# Patient Record
Sex: Male | Born: 1965 | Race: White | Hispanic: No | Marital: Married | Smoking: Never smoker
Health system: Southern US, Community
[De-identification: ages and names within clinical notes are randomized; demographics above are authoritative.]

## PROBLEM LIST (undated history)

## (undated) DIAGNOSIS — M1990 Unspecified osteoarthritis, unspecified site: Secondary | ICD-10-CM

## (undated) DIAGNOSIS — I10 Essential (primary) hypertension: Secondary | ICD-10-CM

## (undated) DIAGNOSIS — E079 Disorder of thyroid, unspecified: Secondary | ICD-10-CM

## (undated) DIAGNOSIS — Z8619 Personal history of other infectious and parasitic diseases: Secondary | ICD-10-CM

## (undated) DIAGNOSIS — K219 Gastro-esophageal reflux disease without esophagitis: Secondary | ICD-10-CM

## (undated) DIAGNOSIS — E785 Hyperlipidemia, unspecified: Secondary | ICD-10-CM

## (undated) DIAGNOSIS — N059 Unspecified nephritic syndrome with unspecified morphologic changes: Secondary | ICD-10-CM

## (undated) DIAGNOSIS — S82401A Unspecified fracture of shaft of right fibula, initial encounter for closed fracture: Secondary | ICD-10-CM

## (undated) DIAGNOSIS — E119 Type 2 diabetes mellitus without complications: Secondary | ICD-10-CM

## (undated) DIAGNOSIS — Z86010 Personal history of colonic polyps: Secondary | ICD-10-CM

## (undated) DIAGNOSIS — S82201A Unspecified fracture of shaft of right tibia, initial encounter for closed fracture: Secondary | ICD-10-CM

## (undated) DIAGNOSIS — T7840XA Allergy, unspecified, initial encounter: Secondary | ICD-10-CM

## (undated) DIAGNOSIS — S42001A Fracture of unspecified part of right clavicle, initial encounter for closed fracture: Secondary | ICD-10-CM

## (undated) HISTORY — DX: Unspecified osteoarthritis, unspecified site: M19.90

## (undated) HISTORY — DX: Unspecified fracture of shaft of right fibula, initial encounter for closed fracture: S82.401A

## (undated) HISTORY — DX: Fracture of unspecified part of right clavicle, initial encounter for closed fracture: S42.001A

## (undated) HISTORY — PX: WISDOM TOOTH EXTRACTION: SHX21

## (undated) HISTORY — DX: Allergy, unspecified, initial encounter: T78.40XA

## (undated) HISTORY — DX: Essential (primary) hypertension: I10

## (undated) HISTORY — DX: Unspecified nephritic syndrome with unspecified morphologic changes: N05.9

## (undated) HISTORY — DX: Personal history of colonic polyps: Z86.010

## (undated) HISTORY — DX: Unspecified fracture of shaft of right tibia, initial encounter for closed fracture: S82.201A

## (undated) HISTORY — DX: Disorder of thyroid, unspecified: E07.9

## (undated) HISTORY — DX: Personal history of other infectious and parasitic diseases: Z86.19

## (undated) HISTORY — DX: Gastro-esophageal reflux disease without esophagitis: K21.9

## (undated) HISTORY — DX: Type 2 diabetes mellitus without complications: E11.9

## (undated) HISTORY — DX: Hyperlipidemia, unspecified: E78.5

---
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
## 1996-10-03 DIAGNOSIS — S82401A Unspecified fracture of shaft of right fibula, initial encounter for closed fracture: Secondary | ICD-10-CM

## 1996-10-03 HISTORY — DX: Unspecified fracture of shaft of right fibula, initial encounter for closed fracture: S82.401A

## 1999-12-07 ENCOUNTER — Encounter: Payer: Self-pay | Admitting: Internal Medicine

## 1999-12-07 ENCOUNTER — Encounter: Admission: RE | Admit: 1999-12-07 | Discharge: 1999-12-07 | Payer: Self-pay | Admitting: Internal Medicine

## 2007-08-23 ENCOUNTER — Encounter: Admission: RE | Admit: 2007-08-23 | Discharge: 2007-08-23 | Payer: Self-pay | Admitting: Internal Medicine

## 2008-07-29 ENCOUNTER — Ambulatory Visit: Payer: Self-pay | Admitting: Internal Medicine

## 2008-09-22 ENCOUNTER — Ambulatory Visit: Payer: Self-pay | Admitting: Internal Medicine

## 2009-01-09 ENCOUNTER — Ambulatory Visit: Payer: Self-pay | Admitting: Internal Medicine

## 2009-02-03 ENCOUNTER — Ambulatory Visit: Payer: Self-pay | Admitting: Internal Medicine

## 2009-10-27 ENCOUNTER — Encounter: Payer: Self-pay | Admitting: Internal Medicine

## 2009-10-27 ENCOUNTER — Ambulatory Visit: Payer: Self-pay | Admitting: Internal Medicine

## 2010-04-06 ENCOUNTER — Ambulatory Visit: Payer: Self-pay | Admitting: Internal Medicine

## 2010-10-26 ENCOUNTER — Ambulatory Visit: Admit: 2010-10-26 | Payer: Self-pay | Admitting: Internal Medicine

## 2010-10-26 ENCOUNTER — Encounter: Payer: Self-pay | Admitting: Internal Medicine

## 2010-10-27 ENCOUNTER — Encounter: Payer: Self-pay | Admitting: Internal Medicine

## 2010-10-28 ENCOUNTER — Encounter: Payer: Self-pay | Admitting: Internal Medicine

## 2010-10-28 ENCOUNTER — Ambulatory Visit: Admit: 2010-10-28 | Payer: Self-pay | Admitting: Internal Medicine

## 2010-11-02 ENCOUNTER — Ambulatory Visit
Admission: RE | Admit: 2010-11-02 | Discharge: 2010-11-02 | Payer: Self-pay | Source: Home / Self Care | Attending: Internal Medicine | Admitting: Internal Medicine

## 2010-11-02 DIAGNOSIS — E785 Hyperlipidemia, unspecified: Secondary | ICD-10-CM

## 2010-11-02 DIAGNOSIS — I10 Essential (primary) hypertension: Secondary | ICD-10-CM

## 2010-11-02 DIAGNOSIS — E039 Hypothyroidism, unspecified: Secondary | ICD-10-CM

## 2010-11-05 ENCOUNTER — Emergency Department (HOSPITAL_COMMUNITY)
Admission: EM | Admit: 2010-11-05 | Discharge: 2010-11-05 | Disposition: A | Discharge status: Home / Self Care | Payer: 59 | Attending: Emergency Medicine | Admitting: Emergency Medicine

## 2010-11-05 ENCOUNTER — Emergency Department (HOSPITAL_COMMUNITY): Payer: 59

## 2010-11-05 DIAGNOSIS — R42 Dizziness and giddiness: Secondary | ICD-10-CM | POA: Insufficient documentation

## 2010-11-05 DIAGNOSIS — I10 Essential (primary) hypertension: Secondary | ICD-10-CM | POA: Insufficient documentation

## 2010-11-05 DIAGNOSIS — E039 Hypothyroidism, unspecified: Secondary | ICD-10-CM | POA: Insufficient documentation

## 2010-11-05 DIAGNOSIS — R002 Palpitations: Secondary | ICD-10-CM | POA: Insufficient documentation

## 2010-11-05 DIAGNOSIS — Z79899 Other long term (current) drug therapy: Secondary | ICD-10-CM | POA: Insufficient documentation

## 2010-11-05 DIAGNOSIS — Z7982 Long term (current) use of aspirin: Secondary | ICD-10-CM | POA: Insufficient documentation

## 2010-11-05 LAB — POCT CARDIAC MARKERS
CKMB, poc: <1 ng/mL — ABNORMAL LOW (ref 1.0–8.0)
CKMB, poc: <1 ng/mL — ABNORMAL LOW (ref 1.0–8.0)
Myoglobin, poc: 63.8 ng/mL (ref 12–200)
Troponin i, poc: <0.05 ng/mL (ref 0.00–0.09)
Troponin i, poc: <0.05 ng/mL (ref 0.00–0.09)

## 2010-11-05 LAB — COMPREHENSIVE METABOLIC PANEL
ALT: 38 U/L (ref 0–53)
AST: 33 U/L (ref 0–37)
Albumin: 4.6 g/dL (ref 3.5–5.2)
Alkaline Phosphatase: 57 U/L (ref 39–117)
BUN: 12 mg/dL (ref 6–23)
CO2: 24 mEq/L (ref 19–32)
Calcium: 9.2 mg/dL (ref 8.4–10.5)
Chloride: 103 mEq/L (ref 96–112)
Creatinine, Ser: 1.07 mg/dL (ref 0.4–1.5)
GFR calc Af Amer: >60 mL/min (ref >60)
GFR calc non Af Amer: >60 mL/min (ref >60)
Glucose, Bld: 144 mg/dL — ABNORMAL HIGH (ref 70–99)
Potassium: 3.7 mEq/L (ref 3.5–5.1)
Sodium: 136 mEq/L (ref 135–145)
Total Bilirubin: 0.5 mg/dL (ref 0.3–1.2)
Total Protein: 7.4 g/dL (ref 6.0–8.3)

## 2010-11-05 LAB — DIFFERENTIAL
Basophils Absolute: 0 10*3/uL (ref 0.0–0.1)
Basophils Relative: 0 % (ref 0–1)
Eosinophils Absolute: 0.1 10*3/uL (ref 0.0–0.7)
Eosinophils Relative: 1 % (ref 0–5)
Lymphocytes Relative: 37 % (ref 12–46)
Lymphs Abs: 2 10*3/uL (ref 0.7–4.0)
Monocytes Absolute: 0.4 10*3/uL (ref 0.1–1.0)
Monocytes Relative: 7 % (ref 3–12)
Neutro Abs: 3.1 10*3/uL (ref 1.7–7.7)
Neutrophils Relative %: 55 % (ref 43–77)

## 2010-11-05 LAB — CBC
HCT: 43.6 % (ref 39.0–52.0)
Hemoglobin: 14.6 g/dL (ref 13.0–17.0)
MCH: 26.7 pg (ref 26.0–34.0)
MCHC: 33.5 g/dL (ref 30.0–36.0)
MCV: 79.9 fL (ref 78.0–100.0)
Platelets: 273 10*3/uL (ref 150–400)
RBC: 5.46 MIL/uL (ref 4.22–5.81)
RDW: 12.5 % (ref 11.5–15.5)
WBC: 5.6 10*3/uL (ref 4.0–10.5)

## 2010-11-29 ENCOUNTER — Encounter: Payer: Self-pay | Admitting: Internal Medicine

## 2010-11-29 ENCOUNTER — Ambulatory Visit (INDEPENDENT_AMBULATORY_CARE_PROVIDER_SITE_OTHER): Payer: 59 | Admitting: Internal Medicine

## 2010-11-29 ENCOUNTER — Other Ambulatory Visit: Payer: Self-pay | Admitting: Internal Medicine

## 2010-11-29 DIAGNOSIS — R002 Palpitations: Secondary | ICD-10-CM | POA: Insufficient documentation

## 2010-11-29 DIAGNOSIS — Z136 Encounter for screening for cardiovascular disorders: Secondary | ICD-10-CM

## 2010-11-29 DIAGNOSIS — E785 Hyperlipidemia, unspecified: Secondary | ICD-10-CM | POA: Insufficient documentation

## 2010-11-29 DIAGNOSIS — Z79899 Other long term (current) drug therapy: Secondary | ICD-10-CM

## 2010-11-30 ENCOUNTER — Telehealth: Payer: Self-pay | Admitting: Internal Medicine

## 2010-11-30 LAB — TSH: TSH: 1.41 u[IU]/mL (ref 0.35–5.50)

## 2010-11-30 LAB — HEMOGLOBIN A1C: Hgb A1c MFr Bld: 6.2 % (ref 4.6–6.5)

## 2010-12-08 ENCOUNTER — Encounter (INDEPENDENT_AMBULATORY_CARE_PROVIDER_SITE_OTHER): Payer: Self-pay | Admitting: *Deleted

## 2010-12-09 ENCOUNTER — Encounter: Payer: Self-pay | Admitting: Internal Medicine

## 2010-12-09 NOTE — Letter (Signed)
Summary: [facility]  [facility]   Imported By: Marylou Mccoy 12/03/2010 15:48:57  _____________________________________________________________________  External Attachment:    Type:   Image     Comment:   External Document

## 2010-12-09 NOTE — Letter (Signed)
Summary: Dr. Germ[facility] 2010-2012  Dr[facility] 2010-2012   Imported By: Marylou Mccoy 11/26/2010 14:52:53  _____________________________________________________________________  External Attachment:    Type:   Image     Comment:   External Document

## 2010-12-09 NOTE — Progress Notes (Signed)
Summary: pt calling re lab results  Phone Note Call from Patient   Caller: Patient [phone] ok to leave msg Reason for Call: Talk to Nurse, Lab or Test Results Summary of Call: pt calling re lab results Initial call taken by: Glynda Jaeger,  November 30, 2010 9:31 AM  Follow-up for Phone Call        awaiting lab results, will call pt when have them Meredith Staggers, RN  November 30, 2010 10:08 AM   left mess about lab results Meredith Staggers, RN  November 30, 2010 2:22 PM

## 2010-12-14 NOTE — Assessment & Plan Note (Signed)
Summary: np6/cardiac eval/ family hx of cardiac disease notes on file ...   Visit Type:  eph  CC:  Palpitations and family history.  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 Referred by Dr. Lenord Fellers for further evaluation of palpitations and cardiac assessment.   About 3 weeks ago started on synthroid 75qd. Later that day developed tachypalpitations. Went to ED and ECG was sinus tach 122 no ectopy. Got 1 un NS and felt better. No palpitations since that time but does feel anxious since starting on synthroid.   Denies any known CAD. Has never had a stress test or cath. Strong FHX of CAD with mother Joyce Gross Pedretti) having CAD in her 11s. Active in spurts with mountain biking but not consistent. No exertional CP or undue dyspnea.   Recent lipids TC 152 TG 145 HDL 33 LDL 90   Preventive Screening-Counseling & Management  Alcohol-Tobacco     Smoking Status: never      Drug Use:  no.    Problems Prior to Update: None  Medications Prior to Update: 1)  Lipitor 80 Mg Tabs (Atorvastatin Calcium) .Marland Kitchen.. 1 Tab Once Daily 2)  Ramipril 10 Mg Caps (Ramipril) .... Take One Capsule By Mouth Daily 3)  Tricor 145 Mg Tabs (Fenofibrate) .Marland Kitchen.. 1 Tab Once Daily  Current Medications (verified): 1)  Lipitor 80 Mg Tabs (Atorvastatin Calcium) .Marland Kitchen.. 1 Tab Once Daily 2)  Ramipril 10 Mg Caps (Ramipril) .... Take One Capsule By Mouth Daily 3)  Tricor 145 Mg Tabs (Fenofibrate) .Marland Kitchen.. 1 Tab Once Daily 4)  Synthroid 75 Mcg Tabs (Levothyroxine Sodium) .... Once Daily 5)  Aspirin 81 Mg Tbec (Aspirin) .... Take One Tablet By Mouth Daily  Allergies (verified): No Known Drug Allergies  Past History:  Past Medical History: Last updated: 11/26/2010 1.Hyperlipidemia 2.Hypertension 3.fractured right clavicle 1981 4.fractured right tibia 1983 5.history of mononucleosis at age 26 6.postreptococcal glomerulnephritis   [phone] 7.fractured  right fibula 1998 8.Bilateral hydrocele repair in 1971  Family History: Last updated: 11/29/2010 Mother has an history of hyperlipidemia ,diabetes ,and hypertension. Mother also has coronary artery disease and is status -post a MI x 2. (started in 48s) Paternal GF had MI  Father history unknown Maternal aunt with diabetes mellitus,depression and hyperlipidemia. PT IS AN ONLY CHILD  Social History: Last updated: 11/29/2010 occupation RN---Surgical Intensive Care Unit at [employer]  Married two small children Tobacco Use - No.  Alcohol Use - no Drug Use - no  Risk Factors: Smoking Status: never (11/29/2010)  Family History: Reviewed history from 11/26/2010 and no changes required. Mother has an history of hyperlipidemia ,diabetes ,and hypertension. Mother also has coronary artery disease and is status -post a MI x 2. (started in 43s) Paternal GF had MI  Father history unknown Maternal aunt with diabetes mellitus,depression and hyperlipidemia. PT IS AN ONLY CHILD  Social History: Reviewed history from 11/26/2010 and no changes required. occupation RN---Surgical Intensive Care Unit at [employer]  Married two small children Tobacco Use - No.  Alcohol Use - no Drug Use - no Smoking Status:  never Drug Use:  no  Review of Systems       As per HPI and past medical history; otherwise all systems negative.   Vital Signs:  Patient profile:   45 year old male Height:      70 inches Weight:      225 pounds BMI:  32.40 Pulse rate:   76 / minute BP sitting:   108 / 64  (right arm) Cuff size:   large  Vitals Entered By: Caralee Ates CMA (November 29, 2010 2:48 PM)  Physical Exam  General:  Well appearing. no resp difficulty HEENT: normal Neck: supple. no JVD. Carotids 2+ bilat; no bruits. No lymphadenopathy or thryomegaly appreciated. Cor: PMI nondisplaced. Regular rate & rhythm. No rubs, gallops, murmur. Lungs: clear Abdomen: obese soft, nontender, nondistended.  No hepatosplenomegaly. No bruits or masses. Good bowel sounds. Extremities: no cyanosis, clubbing, rash, edema Neuro: alert & orientedx3, cranial nerves grossly intact. moves all 4 extremities w/o difficulty. affect pleasant    Impression & Recommendations:  Problem # 1:  PALPITATIONS (ICD-785.1) Suspect PACs/PVCs. Given jitteriness will recheck TFTs. May need to decrease dose. Watch closely for symptoms of OSA which can predispose to ectopy.   Problem # 2:  SCREENING FOR ISCHEMIC HEART DISEASE (ICD-V81.0) Multiple CRFs. Discussed cardiac CT vs ETT. Will start with ETT. Aware of need for diet and exercise to help manage CRFs.  Problem # 3:  HYPERLIPIDEMIA-MIXED (ICD-272.4) LDL at goal (< 100) but HDL still low. Will add fish oil 2g per day.   Other Orders: EKG w/ Interpretation (93000) Treadmill (Treadmill) TLB-A1C / Hgb A1C (Glycohemoglobin) (83036-A1C) TLB-T4 (Thyrox), Free [phone]) TLB-TSH (Thyroid Stimulating Hormone) (84443-TSH) TLB-T3, Free (Triiodothyronine) (84481-T3FREE)  Patient Instructions: 1)  Start Fish Oil 2000mg  daily 2)  Labs today 3)  Your physician has requested that you have an exercise tolerance test.  For further information please visit [email or web address]  Please also follow instruction sheet, as given. 4)  Your physician wants you to follow-up in: 6 months.  You will receive a reminder letter in the mail two months in advance. If you don't receive a letter, please call our office to schedule the follow-up appointment.

## 2010-12-14 NOTE — Letter (Signed)
Summary: Appointment - Reschedule  [facility], Main Office  [address]   [place], [place] [address]   Phone: [phone]  Fax: [phone]     December 08, 2010 MRN: [id number]   Benjamin Lopez [address] [place], [place]  [address]   Dear Mr. SHEDD,   Due to a change in our office schedule, your appointment on March 21,2012 at 9:00 must be changed.  It is very important that we reach you to reschedule this appointment. We look forward to participating in your health care needs. Please contact us at the number listed above at your earliest convenience to reschedule this appointment.     Sincerely, Control and instrumentation engineer

## 2010-12-22 ENCOUNTER — Ambulatory Visit (INDEPENDENT_AMBULATORY_CARE_PROVIDER_SITE_OTHER): Payer: 59 | Admitting: Internal Medicine

## 2010-12-22 DIAGNOSIS — Z136 Encounter for screening for cardiovascular disorders: Secondary | ICD-10-CM

## 2010-12-22 DIAGNOSIS — Z8249 Family history of ischemic heart disease and other diseases of the circulatory system: Secondary | ICD-10-CM

## 2010-12-22 DIAGNOSIS — R002 Palpitations: Secondary | ICD-10-CM

## 2010-12-22 NOTE — Progress Notes (Signed)
Exercise Treadmill Test  Pre-Exercise Testing Evaluation Rhythm: normal sinus  Rate: 100   PR:  .13 QRS:  .09  QT:  .34 QTc: .41   P axis: +60 degrees  QRS axis:   ST Segments:  no significant ST changes at rest     Test  Exercise Tolerance Test Ordering MD: Arvilla Meres, MD  Interpreting MD:  Arvilla Meres, MD  Unique Test No: 1  Treadmill:  1  Indication for ETT: known ASHD  Contraindication to ETT: No   Stress Modality: exercise - treadmill  Cardiac Imaging Performed: non   Protocol: standard Bruce - maximal  Max BP:  190/68  Max MPHR (bpm):  178 85% MPR (bpm):  151  MPHR obtained (bpm):  179 % MPHR obtained:  101  Reached 85% MPHR (min:sec):  6:13 Total Exercise Time (min-sec):  12:00  Workload in METS:  13.4 Borg Scale: 16  Reason ETT Terminated:  Fatigue    ST Segment Analysis At Rest: normal ST segments - no evidence of significant ST depression With Exercise: no evidence of significant ST depression  Other Information Arrhythmia:  No Angina during ETT:  absent (0) Quality of ETT:  diagnostic  ETT Interpretation:  normal - no evidence of ischemia by ST analysis  Comments: Normal ETT. Excellent exercise capacity.  Recommendations: Continue risk factor management.

## 2011-01-17 ENCOUNTER — Other Ambulatory Visit: Payer: 59 | Admitting: Internal Medicine

## 2011-01-18 ENCOUNTER — Ambulatory Visit (INDEPENDENT_AMBULATORY_CARE_PROVIDER_SITE_OTHER): Payer: 59 | Admitting: Internal Medicine

## 2011-01-18 DIAGNOSIS — J069 Acute upper respiratory infection, unspecified: Secondary | ICD-10-CM

## 2011-01-18 DIAGNOSIS — L259 Unspecified contact dermatitis, unspecified cause: Secondary | ICD-10-CM

## 2011-01-18 DIAGNOSIS — E039 Hypothyroidism, unspecified: Secondary | ICD-10-CM

## 2011-03-09 ENCOUNTER — Other Ambulatory Visit: Payer: Self-pay

## 2011-03-09 MED ORDER — ATORVASTATIN CALCIUM 80 MG PO TABS: 80.0000 mg | ORAL_TABLET | Freq: Every day | ORAL | Status: DC

## 2011-04-21 ENCOUNTER — Encounter: Payer: Self-pay | Admitting: Internal Medicine

## 2011-04-25 ENCOUNTER — Other Ambulatory Visit: Payer: 59 | Admitting: Internal Medicine

## 2011-04-28 ENCOUNTER — Ambulatory Visit: Payer: 59 | Admitting: Internal Medicine

## 2011-06-07 ENCOUNTER — Other Ambulatory Visit: Payer: 59 | Admitting: Internal Medicine

## 2011-06-09 ENCOUNTER — Encounter: Payer: Self-pay | Admitting: Internal Medicine

## 2011-06-09 ENCOUNTER — Ambulatory Visit (INDEPENDENT_AMBULATORY_CARE_PROVIDER_SITE_OTHER): Payer: Commercial Managed Care - PPO | Admitting: Internal Medicine

## 2011-06-09 DIAGNOSIS — E039 Hypothyroidism, unspecified: Secondary | ICD-10-CM

## 2011-06-09 DIAGNOSIS — I10 Essential (primary) hypertension: Secondary | ICD-10-CM

## 2011-06-09 DIAGNOSIS — E785 Hyperlipidemia, unspecified: Secondary | ICD-10-CM

## 2011-06-09 LAB — LIPID PANEL
HDL: 34 mg/dL — ABNORMAL LOW (ref >39)
LDL Cholesterol: 81 mg/dL (ref 0–99)

## 2011-06-09 LAB — HEPATIC FUNCTION PANEL
Albumin: 4.8 g/dL (ref 3.5–5.2)
Alkaline Phosphatase: 50 U/L (ref 39–117)
Total Bilirubin: 0.6 mg/dL (ref 0.3–1.2)

## 2011-06-29 DIAGNOSIS — I10 Essential (primary) hypertension: Secondary | ICD-10-CM | POA: Insufficient documentation

## 2011-06-29 DIAGNOSIS — E039 Hypothyroidism, unspecified: Secondary | ICD-10-CM | POA: Insufficient documentation

## 2011-06-29 NOTE — Progress Notes (Signed)
  Subjective:    Patient ID: KANO HECKMANN, male    DOB: 03/26/1966, 45 y.o.   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 Strong family history of coronary artery disease in his mother. She also has hypertension, diabetes, and hyperlipidemia but has smoked for many years. Patient is a nonsmoker. Just found to have hypothyroidism earlier this year.  History of fractured right clavicle 1981, fractured right tibia 1983, mononucleosis at age 66, poststreptococcal glomerular nephritis 1986, fractured right fibula 1998. No known drug allergies, bilateral hydrocele repair 1971. Obtains immunizations through hospital employment.  History of duplication of left collecting system    Review of Systems     Objective:   Physical Exam neck supple no thyromegaly, carotid bruits, or adenopathy; chest clear to auscultation; cardiac exam regular rate and rhythm normal S1 and S2; extremities without edema        Assessment & Plan:  Hypothyroidism  Hyperlipidemia  Hypertension  Plan: Patient is return in 6 months for physical examination. TSH fasting lipid panel and liver functions to be checked. Patient needs to diet exercise and lose some weight.

## 2011-07-11 ENCOUNTER — Other Ambulatory Visit: Payer: Self-pay | Admitting: Internal Medicine

## 2011-07-19 ENCOUNTER — Other Ambulatory Visit: Payer: Self-pay | Admitting: *Deleted

## 2011-07-19 MED ORDER — ATORVASTATIN CALCIUM 80 MG PO TABS: 80.0000 mg | ORAL_TABLET | Freq: Every day | ORAL | Status: DC

## 2011-09-19 ENCOUNTER — Other Ambulatory Visit: Payer: Self-pay | Admitting: Internal Medicine

## 2011-09-20 ENCOUNTER — Encounter: Payer: Self-pay | Admitting: Internal Medicine

## 2011-09-20 ENCOUNTER — Ambulatory Visit (INDEPENDENT_AMBULATORY_CARE_PROVIDER_SITE_OTHER): Payer: Commercial Managed Care - PPO | Admitting: Internal Medicine

## 2011-09-20 VITALS — BP 122/80 | HR 88 | Temp 98.2°F | Wt 225.0 lb

## 2011-09-20 DIAGNOSIS — H6693 Otitis media, unspecified, bilateral: Secondary | ICD-10-CM

## 2011-09-20 DIAGNOSIS — J029 Acute pharyngitis, unspecified: Secondary | ICD-10-CM

## 2011-09-20 DIAGNOSIS — E785 Hyperlipidemia, unspecified: Secondary | ICD-10-CM

## 2011-09-20 DIAGNOSIS — J069 Acute upper respiratory infection, unspecified: Secondary | ICD-10-CM

## 2011-09-20 DIAGNOSIS — I10 Essential (primary) hypertension: Secondary | ICD-10-CM

## 2011-09-20 DIAGNOSIS — H6690 Otitis media, unspecified, unspecified ear: Secondary | ICD-10-CM

## 2011-09-20 DIAGNOSIS — E039 Hypothyroidism, unspecified: Secondary | ICD-10-CM

## 2011-09-20 LAB — POCT RAPID STREP A (OFFICE): Rapid Strep A Screen: NEGATIVE

## 2011-09-20 NOTE — Patient Instructions (Signed)
Take Biaxin twice daily with food as prescribed. Use Hycodan 1 teaspoon every 6 hours as needed for cough. Call if not better in 10 days or sooner if worse.

## 2011-09-20 NOTE — Progress Notes (Signed)
  Subjective:    Patient ID: Benjamin Lopez, male    DOB: [birth year], 45 y.o.   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 Has been ill for 2 or 3 days. Daughter is had similar illness. No fever or shaking chills. Had influenza immunization at work.    Review of Systems     Objective:   Physical Exam pharynx is red without exudate. Rapid strep screen is negative. TMs are slightly full bilaterally and pink. Neck is supple without significant adenopathy. Chest clear to auscultation. Rapid strep screen is negative.        Assessment & Plan:  Pharyngitis  Upper respiratory infection  Bilateral otitis media  Plan: Biaxin 500 mg by mouth twice daily for 10 days. Hycodan 8 ounces 1 teaspoon by mouth every 6 hours as needed for cough. Call if not better in 10 days or sooner if worse.

## 2011-12-06 ENCOUNTER — Other Ambulatory Visit: Payer: Self-pay | Admitting: Internal Medicine

## 2011-12-06 ENCOUNTER — Other Ambulatory Visit: Payer: Commercial Managed Care - PPO | Admitting: Internal Medicine

## 2011-12-06 DIAGNOSIS — E039 Hypothyroidism, unspecified: Secondary | ICD-10-CM

## 2011-12-06 DIAGNOSIS — Z0000 Encounter for general adult medical examination without abnormal findings: Secondary | ICD-10-CM

## 2011-12-06 LAB — CBC WITH DIFFERENTIAL/PLATELET
Basophils Absolute: 0 10*3/uL (ref 0.0–0.1)
Basophils Relative: 0 % (ref 0–1)
Eosinophils Absolute: 0.1 10*3/uL (ref 0.0–0.7)
Eosinophils Relative: 1 % (ref 0–5)
HCT: 46.3 % (ref 39.0–52.0)
Hemoglobin: 15.3 g/dL (ref 13.0–17.0)
MCH: 27.3 pg (ref 26.0–34.0)
MCHC: 33 g/dL (ref 30.0–36.0)
Monocytes Absolute: 0.4 10*3/uL (ref 0.1–1.0)
Monocytes Relative: 8 % (ref 3–12)
Neutro Abs: 2.4 10*3/uL (ref 1.7–7.7)
RDW: 12.9 % (ref 11.5–15.5)

## 2011-12-06 LAB — LIPID PANEL
HDL: 30 mg/dL — ABNORMAL LOW (ref >39)
LDL Cholesterol: 91 mg/dL (ref 0–99)
Total CHOL/HDL Ratio: 4.7 Ratio
Triglycerides: 105 mg/dL (ref <150)
VLDL: 21 mg/dL (ref 0–40)

## 2011-12-06 LAB — COMPREHENSIVE METABOLIC PANEL
AST: 40 U/L — ABNORMAL HIGH (ref 0–37)
Albumin: 4.6 g/dL (ref 3.5–5.2)
Alkaline Phosphatase: 49 U/L (ref 39–117)
BUN: 16 mg/dL (ref 6–23)
Creat: 1.16 mg/dL (ref 0.50–1.35)
Glucose, Bld: 109 mg/dL — ABNORMAL HIGH (ref 70–99)
Total Bilirubin: 0.5 mg/dL (ref 0.3–1.2)

## 2011-12-06 LAB — TSH: TSH: 3.121 u[IU]/mL (ref 0.350–4.500)

## 2011-12-08 ENCOUNTER — Encounter: Payer: Commercial Managed Care - PPO | Admitting: Internal Medicine

## 2012-01-23 ENCOUNTER — Encounter: Payer: Self-pay | Admitting: Internal Medicine

## 2012-01-23 ENCOUNTER — Ambulatory Visit (INDEPENDENT_AMBULATORY_CARE_PROVIDER_SITE_OTHER): Payer: Commercial Managed Care - PPO | Admitting: Internal Medicine

## 2012-01-23 VITALS — BP 112/70 | HR 82 | Resp 20 | Ht 70.0 in | Wt 215.0 lb

## 2012-01-23 DIAGNOSIS — R7309 Other abnormal glucose: Secondary | ICD-10-CM

## 2012-01-23 DIAGNOSIS — R7302 Impaired glucose tolerance (oral): Secondary | ICD-10-CM

## 2012-01-23 DIAGNOSIS — E785 Hyperlipidemia, unspecified: Secondary | ICD-10-CM

## 2012-01-23 DIAGNOSIS — I10 Essential (primary) hypertension: Secondary | ICD-10-CM

## 2012-01-23 DIAGNOSIS — Z0000 Encounter for general adult medical examination without abnormal findings: Secondary | ICD-10-CM

## 2012-01-23 DIAGNOSIS — B009 Herpesviral infection, unspecified: Secondary | ICD-10-CM

## 2012-01-23 DIAGNOSIS — E039 Hypothyroidism, unspecified: Secondary | ICD-10-CM

## 2012-01-23 NOTE — Patient Instructions (Addendum)
Increase Synthroid to 0.112 mg daily. Continue other meds. Return in 6 months.

## 2012-01-23 NOTE — Progress Notes (Signed)
  Subjective:    Patient ID: Benjamin Lopez, male    DOB: 03-17-1966, 46 y.o.   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 History of hypothyroidism, hypertension, impaired glucose tolerance, hyperlipidemia. History of herpes simplex type I. Recent lab work within normal limits except for TSH slightly over 3 on Synthroid 0.1 mg daily. I daily TSH should be around 1.00. We are going to increase Synthroid to 0.112 mg daily and reevaluate in 6 months. His hemoglobin A1c is 6.1%. He has not been exercising. Offered to place him on Metformin but he declined.    Review of Systems  Constitutional: Negative.   HENT: Negative.   Eyes: Negative.   Respiratory: Negative.   Cardiovascular: Negative.   Gastrointestinal: Negative.   Genitourinary: Negative.   Musculoskeletal: Negative.   Neurological: Negative.   Hematological: Negative.   Psychiatric/Behavioral: Negative.        Objective:   Physical Exam  Vitals reviewed. Constitutional: He is oriented to person, place, and time. He appears well-developed and well-nourished. No distress.  HENT:  Head: Normocephalic and atraumatic.  Right Ear: External ear normal.  Left Ear: External ear normal.  Mouth/Throat: Oropharynx is clear and moist.  Eyes: Conjunctivae and EOM are normal. Pupils are equal, round, and reactive to light. Right eye exhibits no discharge. Left eye exhibits no discharge. No scleral icterus.  Neck: Neck supple. No JVD present. No thyromegaly present.  Cardiovascular: Normal rate, regular rhythm, normal heart sounds and intact distal pulses.   No murmur heard. Pulmonary/Chest: Effort normal and breath sounds normal. He has no wheezes. He has no rales.  Abdominal: Soft. Bowel sounds are normal. He exhibits no distension and no mass. There is no tenderness. There is no rebound and no guarding.  Genitourinary:  Prostate normal.  Musculoskeletal: Normal range of motion. He exhibits no edema.  Lymphadenopathy:    He has no cervical adenopathy.  Neurological: He is alert and oriented to person, place, and time. He has normal reflexes. He displays normal reflexes. No cranial nerve deficit. Coordination normal.  Skin: Skin is warm and dry. No rash noted. He is not diaphoretic.  Psychiatric: He has a normal mood and affect. His behavior is normal. Judgment and thought content normal.          Assessment & Plan:  Hypertension  Hyperlipidemia  Impaired glucose tolerance  Hypothyroidism  History of herpes simplex type I  Plan: Patient does not want to be on metformin for impaired glucose tolerance. Hemoglobin A1c 6.1%. Needs to diet and exercise a bit better. He wants to try this on his own for several months. He is return in 6 months. TSH needs to be lower. Will increase Synthroid to 0.112 mg daily and recheck TSH in 6 months at which time she'll be due also for fasting lipid panel liver functions and hemoglobin A1c along with office visit blood pressure check.  For health maintenance concerns, immunizations are given through employment. Patient wears glasses and should get yearly eye exam.  Urine specimen dipstick: specific gravity 1.005, pH 5.0 otherwise negative

## 2012-01-26 ENCOUNTER — Other Ambulatory Visit: Payer: Self-pay | Admitting: Internal Medicine

## 2012-01-30 ENCOUNTER — Telehealth: Payer: Self-pay

## 2012-01-30 NOTE — Telephone Encounter (Signed)
Patient's mother calls to request rx for Zovirax  Or Acyclovir ointment/ already has the pills. Has 2 sick children a home. Send to [facility] on [address]

## 2012-01-30 NOTE — Telephone Encounter (Signed)
Call in Zovirax ointment one tube to apply to fever blister five times daily with prn one year refills. MJB

## 2012-02-11 IMAGING — CR DG CHEST 2V
2 series · 2 of 2 positions shown · non-contrast
Comparison: None

CLINICAL DATA: Palpitations

CHEST - 2 VIEW

[w chest pa]
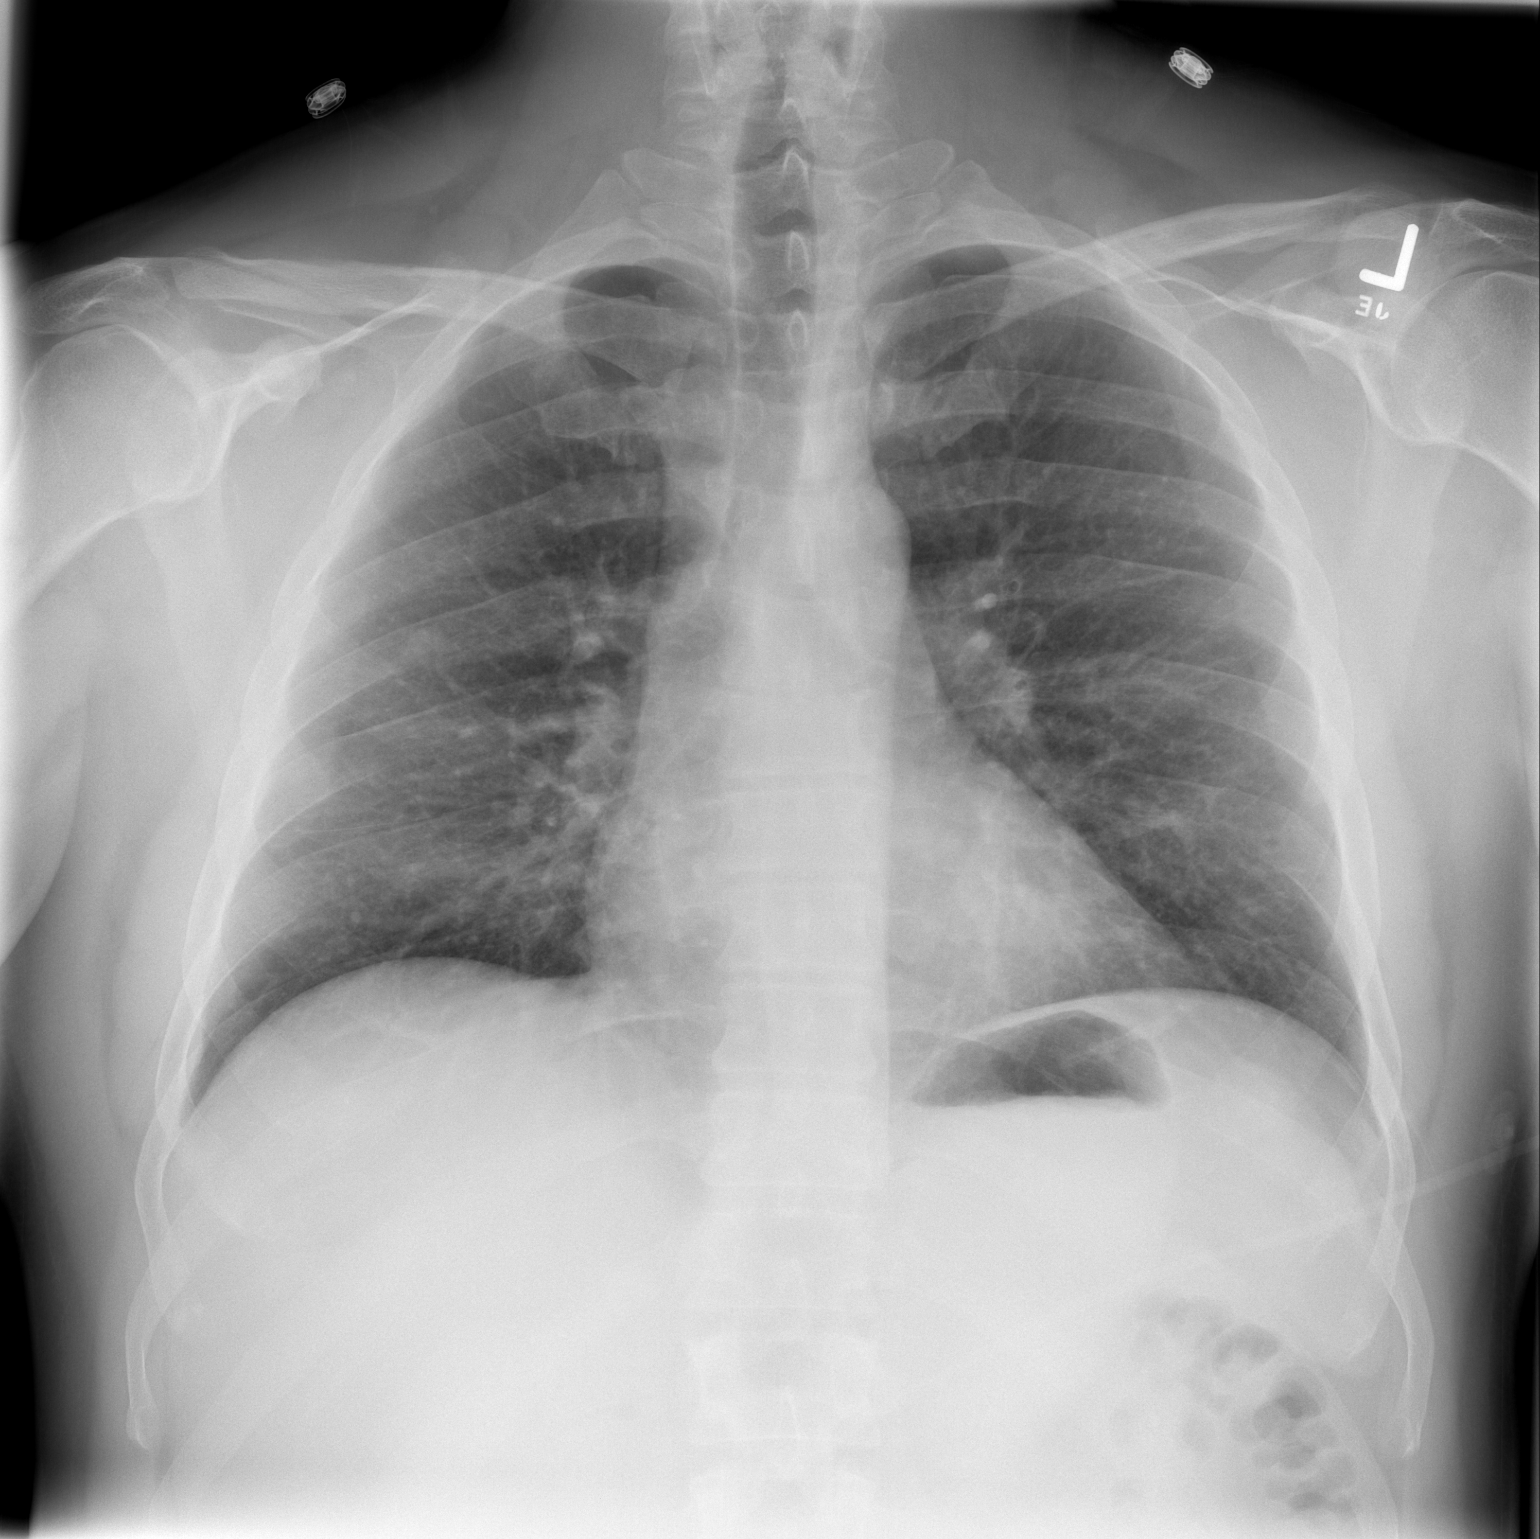

[w chest lat *]
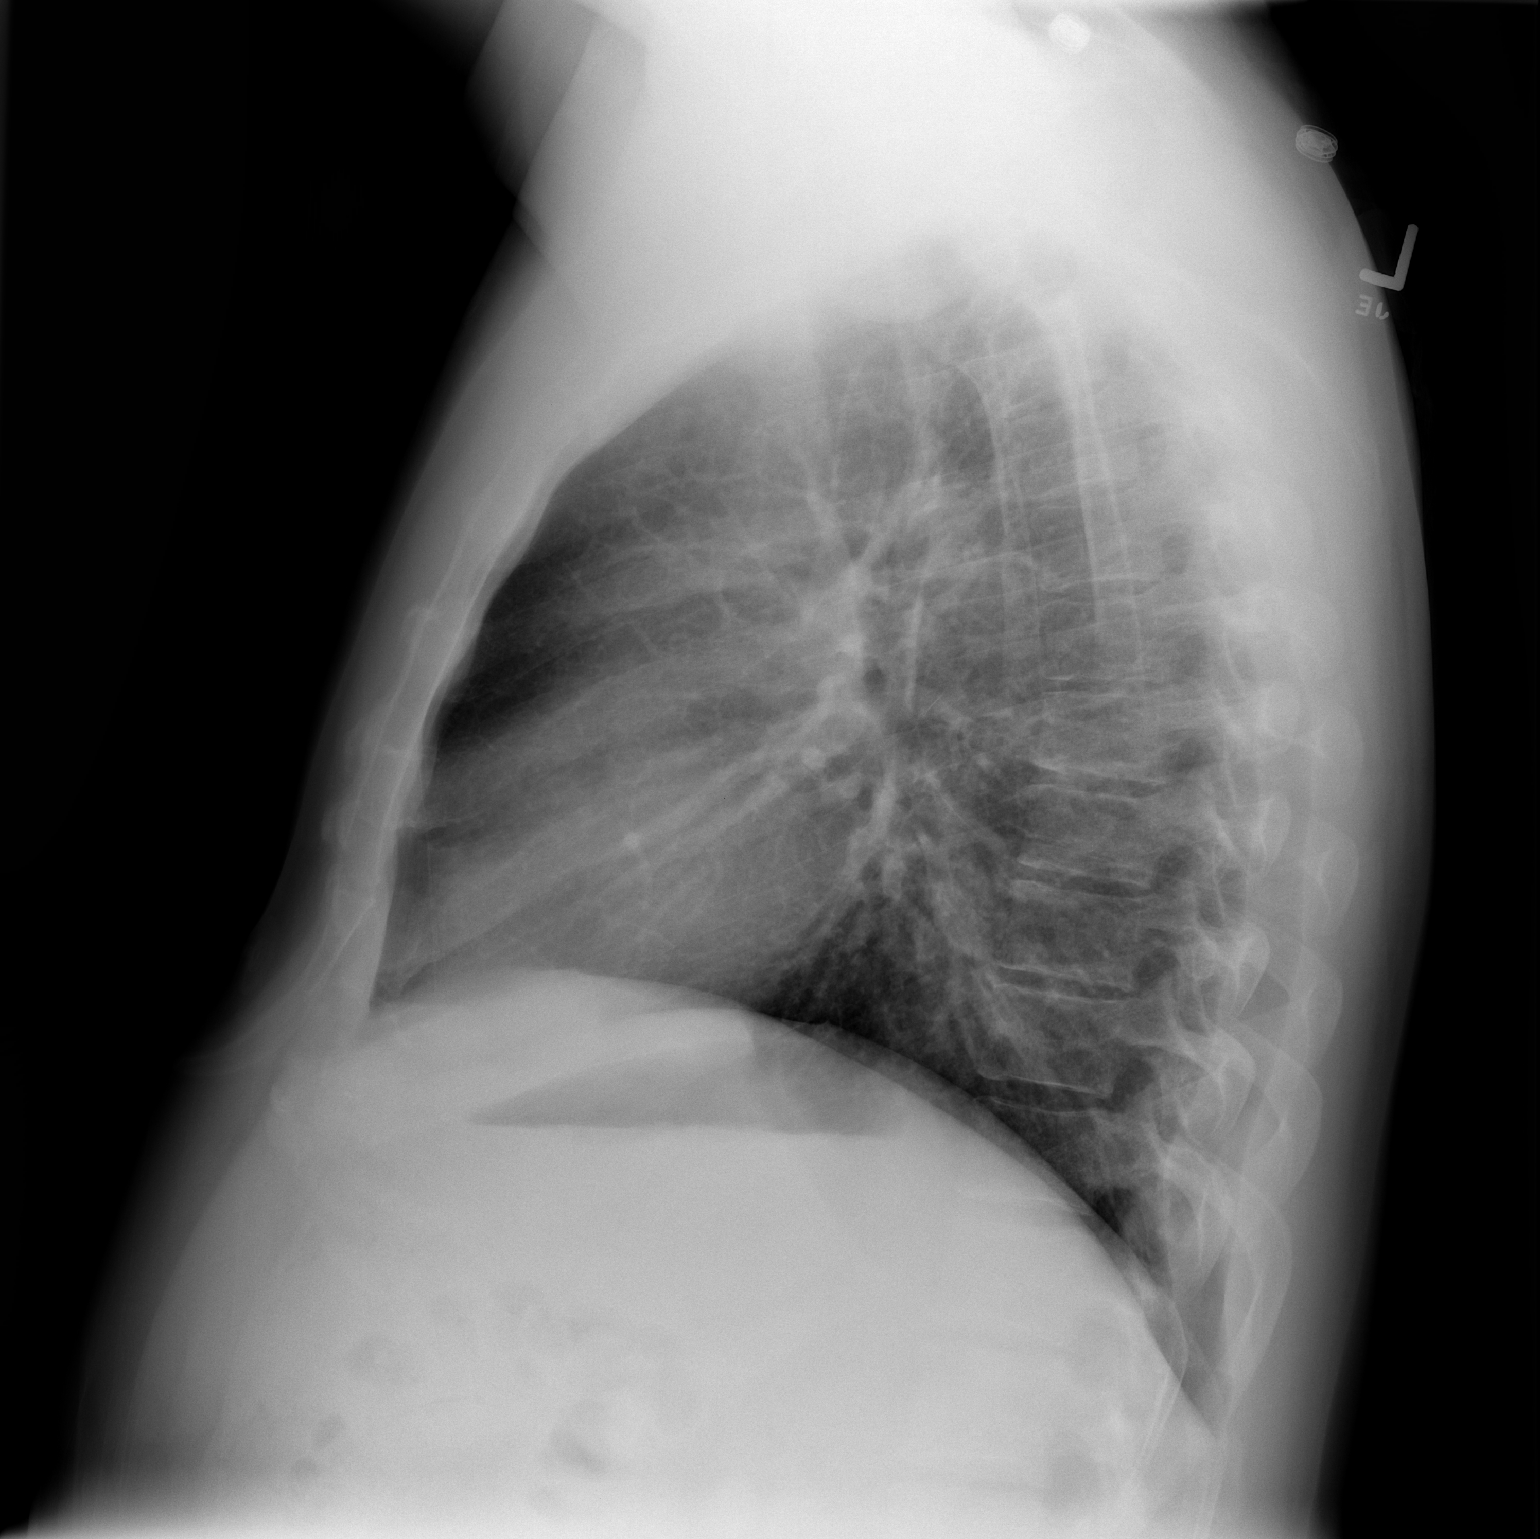

[2 of 2 positions shown; findings below may reference images not displayed]

FINDINGS: The heart size and mediastinal contours are within normal
limits. There is a subtle nodular density within the right midlung
measuring 0.9 cm.
 Both lungs are otherwise clear.  The visualized skeletal
structures are unremarkable.
IMPRESSION: 1.     No acute findings.

2.     Nodule within the right midlung is indeterminate.  Suggest
further evaluation with non emergent noncontrast CT of the chest.

## 2012-04-20 ENCOUNTER — Other Ambulatory Visit: Payer: Self-pay | Admitting: Internal Medicine

## 2012-08-07 ENCOUNTER — Other Ambulatory Visit: Payer: Commercial Managed Care - PPO | Admitting: Internal Medicine

## 2012-08-07 DIAGNOSIS — R7301 Impaired fasting glucose: Secondary | ICD-10-CM

## 2012-08-07 DIAGNOSIS — E039 Hypothyroidism, unspecified: Secondary | ICD-10-CM

## 2012-08-07 DIAGNOSIS — E785 Hyperlipidemia, unspecified: Secondary | ICD-10-CM

## 2012-08-07 DIAGNOSIS — Z79899 Other long term (current) drug therapy: Secondary | ICD-10-CM

## 2012-08-07 LAB — TSH: TSH: 2.588 u[IU]/mL (ref 0.350–4.500)

## 2012-08-07 LAB — LIPID PANEL
Cholesterol: 166 mg/dL (ref 0–200)
LDL Cholesterol: 98 mg/dL (ref 0–99)
Total CHOL/HDL Ratio: 4.9 Ratio
VLDL: 34 mg/dL (ref 0–40)

## 2012-08-07 LAB — HEMOGLOBIN A1C
Hgb A1c MFr Bld: 6.1 % — ABNORMAL HIGH (ref <5.7)
Mean Plasma Glucose: 128 mg/dL — ABNORMAL HIGH (ref <117)

## 2012-08-07 LAB — HEPATIC FUNCTION PANEL
ALT: 51 U/L (ref 0–53)
Bilirubin, Direct: 0.1 mg/dL (ref 0.0–0.3)

## 2012-08-09 ENCOUNTER — Ambulatory Visit (INDEPENDENT_AMBULATORY_CARE_PROVIDER_SITE_OTHER): Payer: Commercial Managed Care - PPO | Admitting: Internal Medicine

## 2012-08-09 ENCOUNTER — Encounter: Payer: Self-pay | Admitting: Internal Medicine

## 2012-08-09 VITALS — BP 116/80 | HR 80 | Temp 98.0°F | Ht 71.0 in | Wt 228.0 lb

## 2012-08-09 DIAGNOSIS — I10 Essential (primary) hypertension: Secondary | ICD-10-CM

## 2012-08-09 DIAGNOSIS — E8881 Metabolic syndrome: Secondary | ICD-10-CM

## 2012-08-09 DIAGNOSIS — R7309 Other abnormal glucose: Secondary | ICD-10-CM

## 2012-08-09 DIAGNOSIS — E785 Hyperlipidemia, unspecified: Secondary | ICD-10-CM

## 2012-08-09 DIAGNOSIS — E039 Hypothyroidism, unspecified: Secondary | ICD-10-CM

## 2012-08-09 DIAGNOSIS — R7302 Impaired glucose tolerance (oral): Secondary | ICD-10-CM

## 2012-08-09 NOTE — Patient Instructions (Addendum)
Watch diet lose weight and return in 6 months. Check on tdap with Employee Health

## 2012-08-09 NOTE — Progress Notes (Signed)
  Subjective:    Patient ID: Benjamin Lopez, male    DOB: [birth year], 46 y.o.   MRN: [id number]  HPI 46 year old white male with history of hypothyroidism, impaired glucose tolerance, hypertension and hyperlipidemia for six-month recheck. Also history of Herpes simplex type I. 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 At last visit April, we increased his Synthroid from 0.1 mg daily to 0.112 mg daily trying to get his TSH from slightly over 3 to closer to 1.  He is overweight. He has not been exercising. Mother has a history of diabetes, hypertension, hyperlipidemia.    Review of Systems     Objective:   Physical Exam skin is warm and dry; neck supple without thyromegaly or JVD; chest clear to auscultation; cardiac exam regular rate and rhythm normal S1 and S2; extremities without edema. Diabetic foot exam without ulcers. Pulses are normal.        Assessment & Plan:  Hyperlipidemia  Obesity  Hypertension  Impaired glucose tolerance  Metabolic syndrome  Hypothyroidism  Plan: Encouraged diet exercise and weight loss. Return in 6 months for physical examination. He has obtained influenza immunization through employment at hospital. Continue same dose of Synthroid 0.112 mg daily. Do not make any change in medications at present time. His hemoglobin A1c is 6.1 percent. TSH is 2.588. Triglycerides are 168. Offered to put him on metformin but he does not want to be on that at present time.

## 2012-09-12 ENCOUNTER — Other Ambulatory Visit: Payer: Self-pay | Admitting: Internal Medicine

## 2012-10-16 ENCOUNTER — Other Ambulatory Visit: Payer: Self-pay | Admitting: Internal Medicine

## 2012-12-17 ENCOUNTER — Other Ambulatory Visit: Payer: Self-pay | Admitting: Internal Medicine

## 2013-01-03 ENCOUNTER — Encounter: Payer: Self-pay | Admitting: Internal Medicine

## 2013-01-03 ENCOUNTER — Ambulatory Visit (INDEPENDENT_AMBULATORY_CARE_PROVIDER_SITE_OTHER): Payer: Commercial Managed Care - PPO | Admitting: Internal Medicine

## 2013-01-03 VITALS — BP 120/84 | Temp 97.8°F | Wt 215.0 lb

## 2013-01-03 DIAGNOSIS — R1031 Right lower quadrant pain: Secondary | ICD-10-CM

## 2013-01-21 ENCOUNTER — Telehealth: Payer: Self-pay | Admitting: Internal Medicine

## 2013-01-21 ENCOUNTER — Other Ambulatory Visit: Payer: Self-pay | Admitting: Internal Medicine

## 2013-01-21 NOTE — Telephone Encounter (Signed)
Advised patient he would need to be seen.  Offered him appointment tomorrow, 4/22 at 11:45 or in the afternoon.  Patient advised he would have child care issues and would need to make arrangements and call me back.

## 2013-01-21 NOTE — Telephone Encounter (Signed)
Please book OV 

## 2013-01-22 ENCOUNTER — Encounter: Payer: Self-pay | Admitting: Internal Medicine

## 2013-01-22 ENCOUNTER — Ambulatory Visit (INDEPENDENT_AMBULATORY_CARE_PROVIDER_SITE_OTHER): Payer: Commercial Managed Care - PPO | Admitting: Internal Medicine

## 2013-01-22 VITALS — BP 112/80 | HR 88 | Temp 97.5°F | Ht 71.0 in | Wt 220.0 lb

## 2013-01-22 DIAGNOSIS — L259 Unspecified contact dermatitis, unspecified cause: Secondary | ICD-10-CM

## 2013-01-22 NOTE — Progress Notes (Signed)
  Subjective:    Patient ID: Benjamin Lopez, male    DOB: 1966-01-21, 47 y.o.   MRN: [id number]  HPI onset of contact dermatitis consistent with poison ivy left upper arm. It's bothering him because it touches his shirt at work. CT. He is planning on going hiking and doesn't want this to progress. He wants to take a steroid dose pack    Review of Systems     Objective:   Physical Exam He has 2 lesions linear in nature consistent with poison ivy left upper inner arm. No other lesions noted.        Assessment & Plan:  Contact dermatitis-poison ivy  Plan: Sterapred DS 10 mg 6 day dosepak. Take as directed. Return when necessary.

## 2013-01-22 NOTE — Patient Instructions (Addendum)
Take Sterapred DS 10 mg 6 day dosepak as directed.

## 2013-01-27 NOTE — Progress Notes (Signed)
  Subjective:    Patient ID: Benjamin Lopez, male    DOB: 12-30-1965, 47 y.o.   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 Thinks he might have a lymph node enlarged or hernia. Thinks he  could have been doing some work around his home such as yard work that could have resulted in musculoskeletal pain.    Review of Systems     Objective:   Physical Exam no hernia to direct palpation. No adenopathy appreciated.        Assessment & Plan:   Musculoskeletal pain right groin  Plan: Patient may take ibuprofen 600 mg 3 times daily as needed and apply ice to right groin area.

## 2013-01-27 NOTE — Patient Instructions (Addendum)
Take ibuprofen 600 mg 3 times daily as needed for pain for 7 days. Apply ice to right groin. Call if symptoms persist.

## 2013-02-26 ENCOUNTER — Other Ambulatory Visit: Payer: Commercial Managed Care - PPO | Admitting: Internal Medicine

## 2013-02-26 ENCOUNTER — Other Ambulatory Visit: Payer: Self-pay | Admitting: Internal Medicine

## 2013-02-26 DIAGNOSIS — Z0000 Encounter for general adult medical examination without abnormal findings: Secondary | ICD-10-CM

## 2013-02-26 DIAGNOSIS — E039 Hypothyroidism, unspecified: Secondary | ICD-10-CM

## 2013-02-26 DIAGNOSIS — E785 Hyperlipidemia, unspecified: Secondary | ICD-10-CM

## 2013-02-26 LAB — TSH: TSH: 1.49 u[IU]/mL (ref 0.350–4.500)

## 2013-02-26 LAB — CBC WITH DIFFERENTIAL/PLATELET
Basophils Relative: 0 % (ref 0–1)
Eosinophils Absolute: 0.2 10*3/uL (ref 0.0–0.7)
MCH: 26 pg (ref 26.0–34.0)
MCHC: 33.4 g/dL (ref 30.0–36.0)
Neutro Abs: 2.2 10*3/uL (ref 1.7–7.7)
Neutrophils Relative %: 45 % (ref 43–77)
Platelets: 304 10*3/uL (ref 150–400)
RBC: 5.96 MIL/uL — ABNORMAL HIGH (ref 4.22–5.81)

## 2013-02-26 LAB — LIPID PANEL
LDL Cholesterol: 93 mg/dL (ref 0–99)
VLDL: 33 mg/dL (ref 0–40)

## 2013-02-26 LAB — COMPREHENSIVE METABOLIC PANEL
ALT: 21 U/L (ref 0–53)
AST: 20 U/L (ref 0–37)
Alkaline Phosphatase: 48 U/L (ref 39–117)
Potassium: 4.5 mEq/L (ref 3.5–5.3)
Sodium: 141 mEq/L (ref 135–145)
Total Bilirubin: 0.6 mg/dL (ref 0.3–1.2)
Total Protein: 7 g/dL (ref 6.0–8.3)

## 2013-02-28 ENCOUNTER — Ambulatory Visit (INDEPENDENT_AMBULATORY_CARE_PROVIDER_SITE_OTHER): Payer: Commercial Managed Care - PPO | Admitting: Internal Medicine

## 2013-02-28 ENCOUNTER — Encounter: Payer: Self-pay | Admitting: Internal Medicine

## 2013-02-28 VITALS — BP 116/84 | HR 72 | Temp 97.7°F | Ht 70.0 in | Wt 217.0 lb

## 2013-02-28 DIAGNOSIS — Z0000 Encounter for general adult medical examination without abnormal findings: Secondary | ICD-10-CM

## 2013-02-28 DIAGNOSIS — R7309 Other abnormal glucose: Secondary | ICD-10-CM

## 2013-02-28 DIAGNOSIS — R7302 Impaired glucose tolerance (oral): Secondary | ICD-10-CM

## 2013-02-28 DIAGNOSIS — E785 Hyperlipidemia, unspecified: Secondary | ICD-10-CM

## 2013-02-28 DIAGNOSIS — E039 Hypothyroidism, unspecified: Secondary | ICD-10-CM

## 2013-02-28 DIAGNOSIS — I10 Essential (primary) hypertension: Secondary | ICD-10-CM

## 2013-02-28 LAB — POCT URINALYSIS DIPSTICK
Leukocytes, UA: NEGATIVE
Nitrite, UA: NEGATIVE
Protein, UA: NEGATIVE
Spec Grav, UA: <=1.005
Urobilinogen, UA: NEGATIVE

## 2013-02-28 LAB — HEMOGLOBIN A1C: Hgb A1c MFr Bld: 5.7 % — ABNORMAL HIGH (ref <5.7)

## 2013-02-28 NOTE — Progress Notes (Signed)
  Subjective:    Patient ID: Benjamin Lopez, male    DOB: Jun 29, 1966, 47 y.o.   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 History of impaired glucose tolerance. History of Herpes simplex type I.  Social history: He is married. 2 daughters. Does not smoke. Social alcohol consumption  Family history: Mother with history of coronary artery disease, diabetes mellitus, hypertension, hyperlipidemia. Father's family history is unknown.  Past medical history: History of fractured right clavicle 1981, fractured right tibia 1983, mononucleosis at age 22, post streptococcal glomerular nephritis 1986, fractured right fibula 1998. Bilateral hydrocele repair 1971. History of duplication of left collecting system  No known drug allergies.   Obtains immunizations from hospital employment.      Review of Systems  Constitutional: Negative.   All other systems reviewed and are negative.       Objective:   Physical Exam  Vitals reviewed. Constitutional: He is oriented to person, place, and time. He appears well-developed and well-nourished. No distress.  HENT:  Head: Normocephalic and atraumatic.  Right Ear: External ear normal.  Left Ear: External ear normal.  Mouth/Throat: Oropharynx is clear and moist. No oropharyngeal exudate.  Eyes: Conjunctivae and EOM are normal. Pupils are equal, round, and reactive to light. Right eye exhibits no discharge. Left eye exhibits no discharge. No scleral icterus.  Neck: Neck supple. No JVD present. No thyromegaly present.  Cardiovascular: Normal rate, regular rhythm, normal heart sounds and intact distal pulses.   No murmur heard. Pulmonary/Chest: Effort normal and breath sounds normal. No respiratory distress. He has no wheezes. He has no rales. He exhibits no tenderness.   Abdominal: Soft. Bowel sounds are normal. He exhibits no distension and no mass. There is no tenderness. There is no rebound and no guarding.  Genitourinary:  No hernias  Musculoskeletal: Normal range of motion. He exhibits no edema.  Lymphadenopathy:    He has no cervical adenopathy.  Neurological: He is alert and oriented to person, place, and time. He has normal reflexes. He displays normal reflexes. No cranial nerve deficit. Coordination normal.  Skin: Skin is warm and dry. No rash noted. He is not diaphoretic. No erythema. No pallor.  Psychiatric: He has a normal mood and affect. His behavior is normal. Judgment and thought content normal.           Assessment & Plan:  Hypertension-well-controlled on current regimen  Hypothyroidism-well-controlled on thyroid replacement therapy  Hyperlipidemia-treated with statin  Impaired glucose tolerance patient does not want to be on metformin. Patient has been trying to diet exercise and current hemoglobin A1c is 5.7%. Continue to monitor.  Plan: Return in 6 months for office visit, lipid panel liver functions hemoglobin A1c and TSH.

## 2013-03-02 NOTE — Patient Instructions (Addendum)
Continued efforts at diet and exercise and weight loss. Return in 6 months. Continue same medications

## 2013-06-18 ENCOUNTER — Other Ambulatory Visit: Payer: Self-pay | Admitting: Internal Medicine

## 2013-07-29 ENCOUNTER — Other Ambulatory Visit: Payer: Self-pay | Admitting: Internal Medicine

## 2013-08-28 ENCOUNTER — Ambulatory Visit (INDEPENDENT_AMBULATORY_CARE_PROVIDER_SITE_OTHER): Payer: Commercial Managed Care - PPO | Admitting: Internal Medicine

## 2013-08-28 ENCOUNTER — Encounter: Payer: Self-pay | Admitting: Internal Medicine

## 2013-08-28 VITALS — BP 130/84 | HR 100 | Temp 97.4°F | Ht 70.0 in | Wt 218.0 lb

## 2013-08-28 DIAGNOSIS — J069 Acute upper respiratory infection, unspecified: Secondary | ICD-10-CM

## 2013-08-28 MED ORDER — AZITHROMYCIN 250 MG PO TABS: ORAL_TABLET | ORAL | Status: DC

## 2013-08-28 MED ORDER — HYDROCODONE-HOMATROPINE 5-1.5 MG/5ML PO SYRP: 5.0000 mL | ORAL_SOLUTION | Freq: Three times a day (TID) | ORAL | Status: DC | PRN

## 2013-08-28 NOTE — Patient Instructions (Signed)
Take Z-Pak as directed. Not better in one week have prescription refilled. Take Hycodan sparingly for cough.

## 2013-08-28 NOTE — Progress Notes (Signed)
   Subjective:    Patient ID: Benjamin Lopez, male    DOB: December 26, 1965, 47 y.o.   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 Daughters have been ill with similar illness. No fever or chills. Receive flu vaccine through work. Mother is to have surgery for ovarian mass next week. Has some cough with slight sputum production. No sore throat. No overseas travel. History of hypertension, hyperlipidemia, impaired glucose tolerance.    Review of Systems     Objective:   Physical Exam Pharynx slightly injected. TMs are clear. Neck is supple. Chest clear. Cardiac exam regular rate and rhythm. Extremities without edema. Skin is warm and dry.        Assessment & Plan:  Acute URI  Plan: Zithromax Z-Pak take 2 tablets day one followed by 1 tablet days 2 through 5 with 1 refill. If not better in one week have prescription refilled. Hycodan syrup 1 teaspoon by mouth every 8 hours when necessary cough.

## 2013-09-03 ENCOUNTER — Other Ambulatory Visit: Payer: Commercial Managed Care - PPO | Admitting: Internal Medicine

## 2013-09-05 ENCOUNTER — Ambulatory Visit: Payer: Commercial Managed Care - PPO | Admitting: Internal Medicine

## 2013-11-27 ENCOUNTER — Other Ambulatory Visit: Payer: Self-pay | Admitting: Internal Medicine

## 2013-12-18 ENCOUNTER — Other Ambulatory Visit: Payer: Self-pay

## 2013-12-18 MED ORDER — SYNTHROID 112 MCG PO TABS: 112.0000 ug | ORAL_TABLET | Freq: Every day | ORAL | Status: DC

## 2013-12-18 MED ORDER — FENOFIBRATE 145 MG PO TABS: 145.0000 mg | ORAL_TABLET | Freq: Every day | ORAL | Status: DC

## 2014-03-17 ENCOUNTER — Encounter: Payer: Self-pay | Admitting: Internal Medicine

## 2014-03-17 ENCOUNTER — Ambulatory Visit (INDEPENDENT_AMBULATORY_CARE_PROVIDER_SITE_OTHER): Payer: Commercial Managed Care - PPO | Admitting: Internal Medicine

## 2014-03-17 VITALS — BP 122/84 | HR 74 | Temp 98.6°F | Wt 227.0 lb

## 2014-03-17 DIAGNOSIS — J069 Acute upper respiratory infection, unspecified: Secondary | ICD-10-CM

## 2014-03-17 MED ORDER — HYDROCODONE-HOMATROPINE 5-1.5 MG/5ML PO SYRP: 5.0000 mL | ORAL_SOLUTION | Freq: Three times a day (TID) | ORAL | Status: DC | PRN

## 2014-03-17 MED ORDER — CLARITHROMYCIN 500 MG PO TABS: 500.0000 mg | ORAL_TABLET | Freq: Two times a day (BID) | ORAL | Status: DC

## 2014-03-17 NOTE — Patient Instructions (Signed)
Take Biaxin 500 mg twice a day for 10 days. Take Hycodan for cough as needed.

## 2014-03-17 NOTE — Progress Notes (Signed)
   Subjective:    Patient ID: Benjamin Lopez, male    DOB: 1966/04/12, 48 y.o.   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 No fever or chills. Denies sore throat. Basically has cough and congestion.   Review of Systems     Objective:   Physical Exam TMs are clear. Pharynx is clear. Neck is supple without significant adenopathy. Chest clear to auscultation. He sounds nasally congested       Assessment & Plan:  Acute URI  Plan: Biaxin 500 mg by mouth twice daily for 10 days. Hycodan 4 ounces 1 teaspoon by mouth Q8 hours when necessary cough

## 2014-03-26 ENCOUNTER — Other Ambulatory Visit: Payer: Self-pay | Admitting: Internal Medicine

## 2014-05-29 ENCOUNTER — Other Ambulatory Visit: Payer: Self-pay

## 2014-05-29 MED ORDER — VALACYCLOVIR HCL 500 MG PO TABS: 500.0000 mg | ORAL_TABLET | Freq: Two times a day (BID) | ORAL | Status: DC

## 2014-09-18 ENCOUNTER — Other Ambulatory Visit: Payer: Self-pay | Admitting: Internal Medicine

## 2015-01-06 ENCOUNTER — Other Ambulatory Visit: Payer: Self-pay | Admitting: Internal Medicine

## 2015-01-06 MED ORDER — FENOFIBRATE 145 MG PO TABS: 145.0000 mg | ORAL_TABLET | Freq: Every day | ORAL | Status: DC

## 2015-01-06 NOTE — Telephone Encounter (Signed)
No CPE since 2014 Needs appt may refill until seen

## 2015-01-27 ENCOUNTER — Other Ambulatory Visit: Payer: Self-pay | Admitting: Internal Medicine

## 2015-01-27 ENCOUNTER — Other Ambulatory Visit: Payer: 59 | Admitting: Internal Medicine

## 2015-01-27 DIAGNOSIS — E039 Hypothyroidism, unspecified: Secondary | ICD-10-CM

## 2015-01-27 DIAGNOSIS — E785 Hyperlipidemia, unspecified: Secondary | ICD-10-CM

## 2015-01-27 DIAGNOSIS — Z0000 Encounter for general adult medical examination without abnormal findings: Secondary | ICD-10-CM

## 2015-01-27 LAB — CBC WITH DIFFERENTIAL/PLATELET
BASOS PCT: 0 % (ref 0–1)
Basophils Absolute: 0 10*3/uL (ref 0.0–0.1)
Eosinophils Absolute: 0.1 10*3/uL (ref 0.0–0.7)
Eosinophils Relative: 1 % (ref 0–5)
HEMATOCRIT: 48 % (ref 39.0–52.0)
HEMOGLOBIN: 15.8 g/dL (ref 13.0–17.0)
Lymphocytes Relative: 41 % (ref 12–46)
Lymphs Abs: 2.1 10*3/uL (ref 0.7–4.0)
MCH: 26.7 pg (ref 26.0–34.0)
MCHC: 32.9 g/dL (ref 30.0–36.0)
MCV: 81.1 fL (ref 78.0–100.0)
MONO ABS: 0.3 10*3/uL (ref 0.1–1.0)
MPV: 9.5 fL (ref 8.6–12.4)
Monocytes Relative: 6 % (ref 3–12)
NEUTROS ABS: 2.6 10*3/uL (ref 1.7–7.7)
Neutrophils Relative %: 52 % (ref 43–77)
Platelets: 250 10*3/uL (ref 150–400)
RBC: 5.92 MIL/uL — AB (ref 4.22–5.81)
RDW: 13.2 % (ref 11.5–15.5)
WBC: 5 10*3/uL (ref 4.0–10.5)

## 2015-01-27 NOTE — Addendum Note (Signed)
Addended by: Leota Jacobsen on: 01/27/2015 09:49 AM   Modules accepted: Orders

## 2015-01-28 LAB — COMPLETE METABOLIC PANEL WITH GFR
ALBUMIN: 4.2 g/dL (ref 3.5–5.2)
ALT: 36 U/L (ref 0–53)
AST: 23 U/L (ref 0–37)
Alkaline Phosphatase: 60 U/L (ref 39–117)
BUN: 15 mg/dL (ref 6–23)
CO2: 28 mEq/L (ref 19–32)
Calcium: 9.8 mg/dL (ref 8.4–10.5)
Chloride: 104 mEq/L (ref 96–112)
Creat: 0.86 mg/dL (ref 0.50–1.35)
GFR, Est African American: >89 mL/min
GLUCOSE: 105 mg/dL — AB (ref 70–99)
Potassium: 4.6 mEq/L (ref 3.5–5.3)
Sodium: 140 mEq/L (ref 135–145)
TOTAL PROTEIN: 7 g/dL (ref 6.0–8.3)
Total Bilirubin: 0.5 mg/dL (ref 0.2–1.2)

## 2015-01-28 LAB — LIPID PANEL
CHOLESTEROL: 185 mg/dL (ref 0–200)
HDL: 33 mg/dL — ABNORMAL LOW (ref >=40)
LDL Cholesterol: 125 mg/dL — ABNORMAL HIGH (ref 0–99)
Total CHOL/HDL Ratio: 5.6 Ratio
Triglycerides: 136 mg/dL (ref <150)
VLDL: 27 mg/dL (ref 0–40)

## 2015-01-28 LAB — TSH: TSH: 2.123 u[IU]/mL (ref 0.350–4.500)

## 2015-01-29 ENCOUNTER — Encounter: Payer: Self-pay | Admitting: Internal Medicine

## 2015-01-29 ENCOUNTER — Ambulatory Visit (INDEPENDENT_AMBULATORY_CARE_PROVIDER_SITE_OTHER): Payer: 59 | Admitting: Internal Medicine

## 2015-01-29 VITALS — BP 126/86 | HR 78 | Temp 98.8°F | Wt 223.0 lb

## 2015-01-29 DIAGNOSIS — I10 Essential (primary) hypertension: Secondary | ICD-10-CM | POA: Diagnosis not present

## 2015-01-29 DIAGNOSIS — Z0000 Encounter for general adult medical examination without abnormal findings: Secondary | ICD-10-CM | POA: Diagnosis not present

## 2015-01-29 DIAGNOSIS — M25569 Pain in unspecified knee: Secondary | ICD-10-CM

## 2015-01-29 DIAGNOSIS — E119 Type 2 diabetes mellitus without complications: Secondary | ICD-10-CM

## 2015-01-29 DIAGNOSIS — E669 Obesity, unspecified: Secondary | ICD-10-CM | POA: Diagnosis not present

## 2015-01-29 DIAGNOSIS — B009 Herpesviral infection, unspecified: Secondary | ICD-10-CM

## 2015-01-29 DIAGNOSIS — M25559 Pain in unspecified hip: Secondary | ICD-10-CM | POA: Diagnosis not present

## 2015-01-29 DIAGNOSIS — E8881 Metabolic syndrome: Secondary | ICD-10-CM

## 2015-01-29 DIAGNOSIS — E039 Hypothyroidism, unspecified: Secondary | ICD-10-CM

## 2015-01-29 LAB — POCT URINALYSIS DIPSTICK
BILIRUBIN UA: NEGATIVE
Blood, UA: NEGATIVE
Glucose, UA: NEGATIVE
Ketones, UA: NEGATIVE
LEUKOCYTES UA: NEGATIVE
NITRITE UA: NEGATIVE
PH UA: 7
PROTEIN UA: NEGATIVE
Spec Grav, UA: 1.01
UROBILINOGEN UA: NEGATIVE

## 2015-01-29 LAB — HEMOGLOBIN A1C
Hgb A1c MFr Bld: 6.1 % — ABNORMAL HIGH (ref <5.7)
Mean Plasma Glucose: 128 mg/dL — ABNORMAL HIGH (ref <117)

## 2015-01-29 MED ORDER — ROSUVASTATIN CALCIUM 5 MG PO TABS: 5.0000 mg | ORAL_TABLET | Freq: Every day | ORAL | Status: DC

## 2015-01-29 NOTE — Progress Notes (Signed)
Subjective:    Patient ID: Benjamin Lopez, male    DOB: 12/09/1965, 49 y.o.   MRN: [id number]  HPI 49 year old White Male in today for health maintenance exam and evaluation of medical issues. History of hypertension, obesity, hyperlipidemia, hypothyroidism. He says that a few months ago he began to have joint pain. He spoke with pharmacist. Subsequently discontinued his statin medication. Before discontinuing statin medication, he tried coenzyme Q but didn't think it helped very much. I'm not sure took it very long. Continued to take TriCor. History of mixed hyperlipidemia. I'm concerned about his glucose and we will order a hemoglobin A1c. History of impaired glucose tolerance. Mother has diabetes mellitus as does his aunt. TSH is within normal limits. Blood pressure is stable. Receives influenza vaccine through employment. 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 His weight is 223 pounds and previously was 217 pounds in May 2014. He says he exercises by riding his mountain bike a couple of times a week.  Social history: He is married. 2 daughters. Does not smoke. Social alcohol consumption. 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  Family history: Mother with history of coronary artery disease, colon cancer, diabetes mellitus, hypertension, hyperlipidemia, COPD, osteoarthritis to have knee replacement. Father's family history is unknown. He is an only child.  Past medical history: Fractured right clavicle 1981, fractured right tibia 1983, mononucleosis at age 35, poststreptococcal glomerular nephritis 1986, fractured right ear below 1998, bilateral hydrocele repair 1971. History of duplication of left collecting system.  No known drug allergies    Review of Systems  Constitutional: Negative.   HENT: Negative.   Eyes: Negative.   Respiratory: Negative.   Cardiovascular: Negative.   Gastrointestinal: Negative.   Musculoskeletal: Positive for  arthralgias.  Skin: Negative.   Hematological: Negative.   Psychiatric/Behavioral: Negative.        Objective:   Physical Exam  Constitutional: He is oriented to person, place, and time. He appears well-developed and well-nourished. No distress.  HENT:  Head: Normocephalic and atraumatic.  Right Ear: External ear normal.  Left Ear: External ear normal.  Mouth/Throat: Oropharynx is clear and moist. No oropharyngeal exudate.  Eyes: Conjunctivae and EOM are normal. Pupils are equal, round, and reactive to light. Right eye exhibits no discharge. Left eye exhibits no discharge. No scleral icterus.  Neck: Neck supple. No JVD present. No thyromegaly present.  Cardiovascular: Normal rate, regular rhythm, normal heart sounds and intact distal pulses.   No murmur heard. Pulmonary/Chest: Effort normal and breath sounds normal. No respiratory distress. He has no wheezes. He has no rales.  Abdominal: Soft. Bowel sounds are normal. He exhibits no distension and no mass. There is no tenderness. There is no rebound and no guarding.  Genitourinary: Prostate normal.  Musculoskeletal: Normal range of motion. He exhibits no edema.  Neurological: He is alert and oriented to person, place, and time. He has normal reflexes. He displays normal reflexes. No cranial nerve deficit. Coordination normal.  Skin: Skin is warm and dry. No rash noted. He is not diaphoretic.  Psychiatric: He has a normal mood and affect. His behavior is normal. Judgment and thought content normal.  Vitals reviewed.         Assessment & Plan:  Arthralgias on Lipitor and TriCor. Stop TriCor. Start Crestor and reevaluate in 2 months. I'm not sure  these arthralgias are related to statin medication.  Hypertension-stable  Hypothyroidism-stable on thyroid replacement  Has developed type 2  diabetes mellitus. Needs to diet and exercise and lose weight. Will need home glucose monitor to check Accu-Cheks twice daily and follow up again  in 8 weeks. May get information from [facility]  Obesity  Herpes simplex type I  Plan: See above and return in 8 weeks. Encouraged diet exercise and weight loss.

## 2015-01-29 NOTE — Patient Instructions (Addendum)
Discontinue  Lipitor. Try Crestor 5 mg daily and return in 8 weeks .  Please contact [facility] for free home glucose monitoring counseling. Diet exercise and weight loss.

## 2015-01-30 ENCOUNTER — Telehealth: Payer: Self-pay | Admitting: *Deleted

## 2015-01-30 NOTE — Telephone Encounter (Signed)
Spoke with [employer] patients wife she will have patient call us

## 2015-02-23 ENCOUNTER — Encounter: Payer: Self-pay | Admitting: Internal Medicine

## 2015-02-23 ENCOUNTER — Ambulatory Visit (INDEPENDENT_AMBULATORY_CARE_PROVIDER_SITE_OTHER): Payer: 59 | Admitting: Internal Medicine

## 2015-02-23 VITALS — BP 120/76 | HR 87 | Temp 97.8°F | Wt 224.0 lb

## 2015-02-23 DIAGNOSIS — J069 Acute upper respiratory infection, unspecified: Secondary | ICD-10-CM

## 2015-02-23 MED ORDER — CLARITHROMYCIN 500 MG PO TABS: 500.0000 mg | ORAL_TABLET | Freq: Two times a day (BID) | ORAL | Status: DC

## 2015-02-23 NOTE — Patient Instructions (Signed)
Take Biaxin 500 mg twice daily for 10 days. Take Hycodan if needed for cough. Call if not better in 7-10 days or sooner if worse.

## 2015-02-23 NOTE — Progress Notes (Signed)
   Subjective:    Patient ID: Benjamin Lopez, male    DOB: 1965-11-26, 49 y.o.   MRN: [id number]  HPI  Has come down with URI symptoms. Mother is had knee replacement surgery he's been spending time at night with her. Hasn't been getting a lot of rest. No fever or shaking chills. Some popping in his ears. No significant sore throat. Has some cough and congestion. Has Hycodan on hand for cough.    Review of Systems no discolored sputum production     Objective:   Physical Exam Skin warm and dry. Nodes none. Left TM full but not red. Right TM dull but not red. Neck is supple. Chest clear to auscultation without rales or wheezing. Pharynx is slightly injected without exudate.       Assessment & Plan:  Acute URI  Plan: Biaxin 500 mg twice daily for 10 days. Has Hycodan on hand if needed for cough

## 2015-03-31 ENCOUNTER — Other Ambulatory Visit: Payer: 59 | Admitting: Internal Medicine

## 2015-03-31 DIAGNOSIS — Z79899 Other long term (current) drug therapy: Secondary | ICD-10-CM

## 2015-03-31 DIAGNOSIS — E785 Hyperlipidemia, unspecified: Secondary | ICD-10-CM

## 2015-03-31 LAB — HEPATIC FUNCTION PANEL
ALBUMIN: 4.5 g/dL (ref 3.5–5.2)
ALT: 35 U/L (ref 0–53)
AST: 25 U/L (ref 0–37)
Alkaline Phosphatase: 46 U/L (ref 39–117)
Bilirubin, Direct: 0.1 mg/dL (ref 0.0–0.3)
Indirect Bilirubin: 0.3 mg/dL (ref 0.2–1.2)
TOTAL PROTEIN: 7 g/dL (ref 6.0–8.3)
Total Bilirubin: 0.4 mg/dL (ref 0.2–1.2)

## 2015-03-31 LAB — LIPID PANEL
Cholesterol: 152 mg/dL (ref 0–200)
HDL: 31 mg/dL — ABNORMAL LOW (ref >=40)
LDL CALC: 94 mg/dL (ref 0–99)
TRIGLYCERIDES: 133 mg/dL (ref <150)
Total CHOL/HDL Ratio: 4.9 Ratio
VLDL: 27 mg/dL (ref 0–40)

## 2015-04-02 ENCOUNTER — Encounter: Payer: Self-pay | Admitting: Internal Medicine

## 2015-04-02 ENCOUNTER — Ambulatory Visit (INDEPENDENT_AMBULATORY_CARE_PROVIDER_SITE_OTHER): Payer: 59 | Admitting: Internal Medicine

## 2015-04-02 VITALS — BP 130/84 | HR 88 | Temp 98.0°F | Wt 225.0 lb

## 2015-04-02 DIAGNOSIS — E786 Lipoprotein deficiency: Secondary | ICD-10-CM

## 2015-04-02 DIAGNOSIS — E785 Hyperlipidemia, unspecified: Secondary | ICD-10-CM

## 2015-04-02 DIAGNOSIS — E669 Obesity, unspecified: Secondary | ICD-10-CM | POA: Diagnosis not present

## 2015-04-02 DIAGNOSIS — I10 Essential (primary) hypertension: Secondary | ICD-10-CM | POA: Diagnosis not present

## 2015-04-02 NOTE — Progress Notes (Signed)
   Subjective:    Patient ID: Benjamin Lopez, male    DOB: 05/30/1966, 48 y.o.   MRN: [id number]  HPI History of hypertension and hyperlipidemia. At last visit, was started on Crestor 5 mg daily. Patient had complained that Lipitor and TriCor were causing arthralgias.  He's here today for follow-up. Lipid panel markedly improved. Previously LDL cholesterol was 125 and is now 94. Total cholesterol has improved from 185-152. HDL cholesterol is low at 31. Triglycerides are normal at 133. I'm pleased with his improvement. His blood pressure stable on current regimen. No complaint of arthralgias. Blood pressure stable.    Review of Systems     Objective:   Physical Exam  Neck supple without JVD thyromegaly carotid bruits. Chest clear. Cardiac exam regular rate and rhythm. Extremities without edema      Assessment & Plan:  Hyperlipidemia  Essential hypertension  Obesity  Plan: He had physical exam April 2016. He will follow-up in November. He will need lipid panel liver functions and hemoglobin A1c and TSH at that time. Needs to diet exercise and lose weight.

## 2015-04-02 NOTE — Patient Instructions (Signed)
Continue Crestor 5 mg daily and other medications as previously prescribed. Follow-up in November for six-month follow-up. Diet exercise and weight loss encouraged

## 2015-05-12 ENCOUNTER — Other Ambulatory Visit: Payer: Self-pay | Admitting: Internal Medicine

## 2015-05-22 ENCOUNTER — Other Ambulatory Visit: Payer: Self-pay | Admitting: *Deleted

## 2015-05-22 MED ORDER — SYNTHROID 112 MCG PO TABS: ORAL_TABLET | ORAL | Status: DC

## 2015-05-22 MED ORDER — ROSUVASTATIN CALCIUM 5 MG PO TABS: 5.0000 mg | ORAL_TABLET | Freq: Every day | ORAL | Status: DC

## 2015-08-04 ENCOUNTER — Other Ambulatory Visit: Payer: 59 | Admitting: Internal Medicine

## 2015-08-04 DIAGNOSIS — R7309 Other abnormal glucose: Secondary | ICD-10-CM

## 2015-08-04 DIAGNOSIS — Z79899 Other long term (current) drug therapy: Secondary | ICD-10-CM

## 2015-08-04 DIAGNOSIS — E119 Type 2 diabetes mellitus without complications: Secondary | ICD-10-CM

## 2015-08-04 DIAGNOSIS — E039 Hypothyroidism, unspecified: Secondary | ICD-10-CM

## 2015-08-04 DIAGNOSIS — E785 Hyperlipidemia, unspecified: Secondary | ICD-10-CM

## 2015-08-04 LAB — LIPID PANEL
CHOL/HDL RATIO: 5.8 ratio — AB (ref <=5.0)
CHOLESTEROL: 185 mg/dL (ref 125–200)
HDL: 32 mg/dL — AB (ref >=40)
LDL Cholesterol: 117 mg/dL (ref <130)
TRIGLYCERIDES: 178 mg/dL — AB (ref <150)
VLDL: 36 mg/dL — AB (ref <30)

## 2015-08-04 LAB — HEPATIC FUNCTION PANEL
ALT: 42 U/L (ref 9–46)
AST: 28 U/L (ref 10–40)
Albumin: 4.8 g/dL (ref 3.6–5.1)
Alkaline Phosphatase: 58 U/L (ref 40–115)
BILIRUBIN INDIRECT: 0.4 mg/dL (ref 0.2–1.2)
BILIRUBIN TOTAL: 0.5 mg/dL (ref 0.2–1.2)
Bilirubin, Direct: 0.1 mg/dL (ref <=0.2)
Total Protein: 7.4 g/dL (ref 6.1–8.1)

## 2015-08-04 LAB — HEMOGLOBIN A1C
Hgb A1c MFr Bld: 6.3 % — ABNORMAL HIGH (ref <5.7)
Mean Plasma Glucose: 134 mg/dL — ABNORMAL HIGH (ref <117)

## 2015-08-04 LAB — TSH: TSH: 2.644 u[IU]/mL (ref 0.350–4.500)

## 2015-08-06 ENCOUNTER — Encounter: Payer: Self-pay | Admitting: Internal Medicine

## 2015-08-06 ENCOUNTER — Ambulatory Visit (INDEPENDENT_AMBULATORY_CARE_PROVIDER_SITE_OTHER): Payer: 59 | Admitting: Internal Medicine

## 2015-08-06 VITALS — BP 120/82 | HR 88 | Temp 98.0°F | Resp 18 | Ht 70.0 in | Wt 226.0 lb

## 2015-08-06 DIAGNOSIS — E669 Obesity, unspecified: Secondary | ICD-10-CM | POA: Diagnosis not present

## 2015-08-06 DIAGNOSIS — E8881 Metabolic syndrome: Secondary | ICD-10-CM

## 2015-08-06 DIAGNOSIS — E039 Hypothyroidism, unspecified: Secondary | ICD-10-CM | POA: Diagnosis not present

## 2015-08-06 DIAGNOSIS — E782 Mixed hyperlipidemia: Secondary | ICD-10-CM | POA: Diagnosis not present

## 2015-08-06 DIAGNOSIS — I10 Essential (primary) hypertension: Secondary | ICD-10-CM | POA: Diagnosis not present

## 2015-08-06 DIAGNOSIS — R7302 Impaired glucose tolerance (oral): Secondary | ICD-10-CM

## 2015-08-06 MED ORDER — METFORMIN HCL 500 MG PO TABS: 500.0000 mg | ORAL_TABLET | ORAL | Status: DC

## 2015-08-06 NOTE — Patient Instructions (Addendum)
Start metformin 500 mg daily and return in 8 weeks for OV and AIC. Encouraged diet exercise and weight loss.

## 2015-08-24 ENCOUNTER — Other Ambulatory Visit: Payer: Self-pay | Admitting: Internal Medicine

## 2015-09-02 NOTE — Progress Notes (Signed)
   Subjective:    Patient ID: MASSIMO SHIPES, male    DOB: [birth year], 49 y.o.   MRN: [id number]  HPI Six-month recheck for this 49 year old white male with history of essential hypertension, obesity, hyperlipidemia, metabolic syndrome, hypothyroidism. Doesn't get much exercise. Does ride his mountain bike sometimes. His weight was 223 pounds in April 2016 and 217 pounds in May 2014. He has a strong family history of diabetes in mother and maternal aunt. Mother has history of coronary artery disease. He has a history of impaired glucose tolerance. Recent hemoglobin A1c 6.3%.  He tried statin medication but thought it was causing joint pain. He was also on TriCor for hypertriglyceridemia. Coenzyme Q didn't help much. Was subsequently switched him to Crestor which he tolerates.  Received flu vaccine through employment.  History of herpes simplex type I for which he takes when necessary Valtrex.       Review of SystemsAs above     Objective:   Physical Exam Skin warm and dry. Nodes none. Chest clear. Cardiac exam regular rate and rhythm. Extremity is without edema.       Assessment & Plan:  Hyperlipidemia-treated with Crestor  Hypertension-stable on current regimen   Hypothyroidism-stable on current regimen   Impaired glucose tolerance -hemoglobin A1c 6.3%   Obesity-encouraged diet exercise and weight loss   Metabolic syndrome  Plan: start metformin 500 mg daily and return in 8 weeks for office visit and hemoglobin A1c. Encouraged diet exercise and weight loss.

## 2015-09-24 ENCOUNTER — Other Ambulatory Visit: Payer: Self-pay

## 2015-09-24 MED ORDER — FENOFIBRATE 145 MG PO TABS: 145.0000 mg | ORAL_TABLET | Freq: Every day | ORAL | Status: DC

## 2015-09-29 ENCOUNTER — Ambulatory Visit (INDEPENDENT_AMBULATORY_CARE_PROVIDER_SITE_OTHER): Payer: 59 | Admitting: Internal Medicine

## 2015-09-29 ENCOUNTER — Encounter: Payer: Self-pay | Admitting: Internal Medicine

## 2015-09-29 VITALS — BP 134/72 | HR 84 | Temp 98.4°F | Resp 20 | Ht 70.0 in | Wt 227.0 lb

## 2015-09-29 DIAGNOSIS — H6693 Otitis media, unspecified, bilateral: Secondary | ICD-10-CM | POA: Diagnosis not present

## 2015-09-29 DIAGNOSIS — J069 Acute upper respiratory infection, unspecified: Secondary | ICD-10-CM

## 2015-09-29 MED ORDER — CLARITHROMYCIN 500 MG PO TABS: 500.0000 mg | ORAL_TABLET | Freq: Two times a day (BID) | ORAL | Status: DC

## 2015-09-29 MED ORDER — HYDROCODONE-HOMATROPINE 5-1.5 MG/5ML PO SYRP: 5.0000 mL | ORAL_SOLUTION | Freq: Three times a day (TID) | ORAL | Status: DC | PRN

## 2015-09-29 NOTE — Patient Instructions (Signed)
Biaxin 500 mg twice daily for 10 days. Hycodan 1 teaspoon by mouth every 8 hours when necessary cough. Rest and drink plenty of fluids. 

## 2015-09-29 NOTE — Progress Notes (Signed)
   Subjective:    Patient ID: Benjamin Lopez, male    DOB: 02/19/1966, 49 y.o.   MRN: [id number]  HPI Came down with URI symptoms about a week and a half ago and these symptoms have not improved. Entire family has been sick with respiratory infection. No fever or shaking chills. Slight amount of sputum production. No sore throat. Some ear discomfort.    Review of Systems as above     Objective:   Physical Exam  Both TMs are red and dull bilaterally. Pharynx slightly injected. He sounds a bit hoarse. Neck is supple without adenopathy. Chest clear to auscultation without rales or wheezing.      Assessment & Plan:  Bilateral otitis media  Acute URI  Plan: Hycodan 1 teaspoon by mouth every 8 hours when necessary cough. Biaxin 500 mg twice daily for 10 days. Rest and drink plenty of fluids.

## 2015-10-08 ENCOUNTER — Telehealth: Payer: Self-pay | Admitting: Internal Medicine

## 2015-10-08 NOTE — Telephone Encounter (Signed)
Finishes antibiotic today, but still having persistent cough.  States he has to work this weekend.  Advised that cough will last 3-4 weeks.  No fever.  States that sputum is just tinged/tan.  Cough is worse at night when he's trying to sleep.  Says he is starting to wheeze a bit.  Should he be seen or do you want to call him in something for these symptoms.  Patient is willing to come in tomorrow because he does have to work this weekend.    Pharmacy:  [facility]

## 2015-10-08 NOTE — Telephone Encounter (Signed)
See tomorrow

## 2015-10-09 ENCOUNTER — Ambulatory Visit (INDEPENDENT_AMBULATORY_CARE_PROVIDER_SITE_OTHER): Payer: 59 | Admitting: Internal Medicine

## 2015-10-09 ENCOUNTER — Encounter: Payer: Self-pay | Admitting: Internal Medicine

## 2015-10-09 VITALS — BP 108/88 | HR 88 | Temp 98.4°F | Resp 20 | Ht 70.0 in | Wt 227.0 lb

## 2015-10-09 DIAGNOSIS — J4521 Mild intermittent asthma with (acute) exacerbation: Secondary | ICD-10-CM | POA: Diagnosis not present

## 2015-10-09 MED ORDER — ALBUTEROL SULFATE HFA 108 (90 BASE) MCG/ACT IN AERS: 2.0000 | INHALATION_SPRAY | Freq: Four times a day (QID) | RESPIRATORY_TRACT | Status: DC | PRN

## 2015-10-09 MED ORDER — PREDNISONE 10 MG PO TABS: ORAL_TABLET | ORAL | Status: DC

## 2015-10-09 MED ORDER — LEVOFLOXACIN 500 MG PO TABS: 500.0000 mg | ORAL_TABLET | Freq: Every day | ORAL | Status: DC

## 2015-10-09 MED FILL — levoFLOXacin 500 MG TABS: 500 | 10 days supply | Qty: 10 | Fill #0

## 2015-10-09 MED FILL — predniSONE 10 MG TABS: 10 | 6 days supply | Qty: 21 | Fill #0

## 2015-10-09 MED FILL — VENTOLIN HFA 90 MCG INHALER: 108 (90 BAS | 25 days supply | Qty: 18 | Fill #0

## 2015-10-09 NOTE — Telephone Encounter (Signed)
Patient scheduled at 12:15 10/09/15

## 2015-10-20 ENCOUNTER — Other Ambulatory Visit: Payer: 59 | Admitting: Internal Medicine

## 2015-10-20 DIAGNOSIS — E119 Type 2 diabetes mellitus without complications: Secondary | ICD-10-CM

## 2015-10-21 LAB — HEMOGLOBIN A1C
HEMOGLOBIN A1C: 6.2 % — AB (ref <5.7)
Mean Plasma Glucose: 131 mg/dL — ABNORMAL HIGH (ref <117)

## 2015-10-22 ENCOUNTER — Encounter: Payer: Self-pay | Admitting: Internal Medicine

## 2015-10-22 ENCOUNTER — Ambulatory Visit (INDEPENDENT_AMBULATORY_CARE_PROVIDER_SITE_OTHER): Payer: 59 | Admitting: Internal Medicine

## 2015-10-22 VITALS — BP 112/80 | HR 81 | Temp 97.2°F | Resp 20 | Ht 70.0 in | Wt 222.0 lb

## 2015-10-22 DIAGNOSIS — E669 Obesity, unspecified: Secondary | ICD-10-CM

## 2015-10-22 DIAGNOSIS — I10 Essential (primary) hypertension: Secondary | ICD-10-CM

## 2015-10-22 DIAGNOSIS — E8881 Metabolic syndrome: Secondary | ICD-10-CM | POA: Diagnosis not present

## 2015-10-22 DIAGNOSIS — R7302 Impaired glucose tolerance (oral): Secondary | ICD-10-CM

## 2015-10-22 NOTE — Progress Notes (Signed)
   Subjective:    Patient ID: Benjamin Lopez, male    DOB: 16-Aug-1966, 50 y.o.   MRN: [id number]  HPI 50 year old Male in today for follow-up on impaired glucose tolerance. Hemoglobin A1c has improved 6.2% from 6.3%. Lipid panel was checked in November and was not repeated today. Needs to diet and exercise. Hard to find time to exercise with his schedule. Strong family history of coronary artery disease hyperlipidemia and hypertension. Is recovering from recent respiratory infection. Still has slight cough.  Reminded about diabetic eye exam    Review of Systems     Objective:   Physical Exam  Spent 15 minutes speaking with him about lab results and lifestyle changes. Blood pressure is stable.      Assessment & Plan:  Impaired glucose tolerance-stable and improved  Hyperlipidemia-continue statin therapy  Essential hypertension-continue Ramipril  Obesity  Metabolic syndrome  URI-improving  Plan: Physical exam but for May 2017. Continue same medications. Encouraged diet and exercise.

## 2015-10-22 NOTE — Patient Instructions (Signed)
Needs to find time to exercise. Continue dietary efforts. Return in May for physical exam. Continue same medications.

## 2015-10-28 NOTE — Progress Notes (Signed)
   Subjective:    Patient ID: Benjamin Lopez, male    DOB: Feb 17, 1966, 50 y.o.   MRN: [id number]  HPI Seen December 27 with acute URI treated with Biaxin and Hycodan. Symptoms have not improved. Entire family has had similar symptoms. No fever or shaking chills. Persistent cough that he simply cannot get rid off. History of impaired glucose tolerance, hyperlipidemia, and essential hypertension. No fever or shaking chills.    Review of Systems     Objective:   Physical Exam  Skin warm and dry. Nodes none. Pharynx very slightly injected without exudate. TMs clear. Neck is supple without adenopathy or carotid bruits. Chest without rales occasional scattered wheezing. Cardiac exam: regular rate and rhythm. Extremities without edema.      Assessment & Plan:  Protracted URI  Asthmatic bronchitis  Plan: Levaquin 500 milligrams daily for 10 days. Has Hycodan on hand for cough. Ventolin inhaler 2 sprays by mouth 4 times daily. Sterapred DS 10 mg 6 day Dosepak.

## 2015-10-28 NOTE — Patient Instructions (Signed)
Take Levaquin daily for 10 days. Ventolin inhaler 2 sprays 4 times daily. 6 day prednisone 10 mg dosepak. Take Hycodan as needed for cough. Rest and drink plenty of fluids.

## 2015-11-05 ENCOUNTER — Telehealth: Payer: Self-pay | Admitting: Internal Medicine

## 2015-11-05 ENCOUNTER — Encounter: Payer: Self-pay | Admitting: Internal Medicine

## 2015-11-05 ENCOUNTER — Ambulatory Visit (INDEPENDENT_AMBULATORY_CARE_PROVIDER_SITE_OTHER): Payer: 59 | Admitting: Internal Medicine

## 2015-11-05 ENCOUNTER — Ambulatory Visit
Admission: RE | Admit: 2015-11-05 | Discharge: 2015-11-05 | Disposition: A | Discharge status: Home / Self Care | Payer: Commercial Managed Care - PPO | Source: Ambulatory Visit | Attending: Internal Medicine | Admitting: Internal Medicine

## 2015-11-05 VITALS — BP 144/92 | HR 74 | Temp 97.5°F | Resp 20 | Ht 70.0 in | Wt 226.0 lb

## 2015-11-05 DIAGNOSIS — M549 Dorsalgia, unspecified: Secondary | ICD-10-CM | POA: Diagnosis not present

## 2015-11-05 DIAGNOSIS — M546 Pain in thoracic spine: Secondary | ICD-10-CM | POA: Diagnosis not present

## 2015-11-05 DIAGNOSIS — R9431 Abnormal electrocardiogram [ECG] [EKG]: Secondary | ICD-10-CM

## 2015-11-05 DIAGNOSIS — R05 Cough: Secondary | ICD-10-CM | POA: Diagnosis not present

## 2015-11-05 DIAGNOSIS — R059 Cough, unspecified: Secondary | ICD-10-CM

## 2015-11-05 MED ORDER — CYCLOBENZAPRINE HCL 10 MG PO TABS: 10.0000 mg | ORAL_TABLET | Freq: Three times a day (TID) | ORAL | Status: DC | PRN

## 2015-11-05 MED FILL — CYCLOBENZAPRINE 10 MG TAB: 10 | 10 days supply | Qty: 30 | Fill #0

## 2015-11-05 NOTE — Telephone Encounter (Signed)
Please order 2 view CXR because of cough stat and call pt. Keep appt this afternoon here

## 2015-11-05 NOTE — Telephone Encounter (Signed)
Calls this morning stating that he feels like he's still battling the URI.  States he's having muscle spasms in his back from the coughing and cannot turn side to side due to the spasms.  Wonders if he has cracked a rib from the coughing.  Feels like he may need an x-ray.  Provided patient an appointment for this afternoon at 2:30.  However, patient wanted to know if there's any way that he can go ahead and get the x-ray (his kids are at school now and he can go).  He'll have his kids this afternoon.  If not, he'll make other arrangements and come to see you this afternoon.  Advised that you may prefer to see him PRIOR to ordering the x-ray.    Please advise.

## 2015-11-10 ENCOUNTER — Encounter: Payer: Self-pay | Admitting: Internal Medicine

## 2015-11-10 ENCOUNTER — Ambulatory Visit (INDEPENDENT_AMBULATORY_CARE_PROVIDER_SITE_OTHER): Payer: 59 | Admitting: Internal Medicine

## 2015-11-10 ENCOUNTER — Telehealth: Payer: Self-pay | Admitting: *Deleted

## 2015-11-10 VITALS — BP 112/78 | HR 79 | Temp 98.1°F | Wt 223.0 lb

## 2015-11-10 DIAGNOSIS — J069 Acute upper respiratory infection, unspecified: Secondary | ICD-10-CM | POA: Diagnosis not present

## 2015-11-10 DIAGNOSIS — R05 Cough: Secondary | ICD-10-CM

## 2015-11-10 DIAGNOSIS — I10 Essential (primary) hypertension: Secondary | ICD-10-CM | POA: Diagnosis not present

## 2015-11-10 DIAGNOSIS — R059 Cough, unspecified: Secondary | ICD-10-CM

## 2015-11-10 LAB — CBC WITH DIFFERENTIAL/PLATELET
Basophils Absolute: 0 10*3/uL (ref 0.0–0.1)
Basophils Relative: 0 % (ref 0–1)
EOS ABS: 0.1 10*3/uL (ref 0.0–0.7)
Eosinophils Relative: 1 % (ref 0–5)
HCT: 44 % (ref 39.0–52.0)
HEMOGLOBIN: 15.1 g/dL (ref 13.0–17.0)
LYMPHS ABS: 1.6 10*3/uL (ref 0.7–4.0)
Lymphocytes Relative: 31 % (ref 12–46)
MCH: 27.4 pg (ref 26.0–34.0)
MCHC: 34.3 g/dL (ref 30.0–36.0)
MCV: 79.9 fL (ref 78.0–100.0)
MONO ABS: 0.6 10*3/uL (ref 0.1–1.0)
MONOS PCT: 11 % (ref 3–12)
MPV: 9.4 fL (ref 8.6–12.4)
NEUTROS ABS: 2.9 10*3/uL (ref 1.7–7.7)
NEUTROS PCT: 57 % (ref 43–77)
Platelets: 234 10*3/uL (ref 150–400)
RBC: 5.51 MIL/uL (ref 4.22–5.81)
RDW: 12.8 % (ref 11.5–15.5)
WBC: 5 10*3/uL (ref 4.0–10.5)

## 2015-11-10 MED ORDER — LEVOFLOXACIN 750 MG PO TABS: 750.0000 mg | ORAL_TABLET | Freq: Every day | ORAL | Status: DC

## 2015-11-10 MED ORDER — CEFTRIAXONE SODIUM 1 G IJ SOLR
1.0000 g | Freq: Once | INTRAMUSCULAR | Status: AC
Start: 2015-11-10 — End: 2015-11-10
  Administered 2015-11-10: 1 g via INTRAMUSCULAR

## 2015-11-10 MED ORDER — ESOMEPRAZOLE MAGNESIUM 40 MG PO CPDR: 40.0000 mg | DELAYED_RELEASE_CAPSULE | Freq: Every day | ORAL | Status: DC

## 2015-11-10 MED ORDER — LOSARTAN POTASSIUM 50 MG PO TABS: 50.0000 mg | ORAL_TABLET | Freq: Every day | ORAL | Status: DC

## 2015-11-10 MED ORDER — HYDROCODONE-HOMATROPINE 5-1.5 MG/5ML PO SYRP: 5.0000 mL | ORAL_SOLUTION | Freq: Three times a day (TID) | ORAL | Status: DC | PRN

## 2015-11-10 MED ORDER — PREDNISONE 10 MG PO TABS: ORAL_TABLET | ORAL | Status: DC

## 2015-11-10 MED FILL — predniSONE 10 MG TABS: 10 | 12 days supply | Qty: 42 | Fill #0

## 2015-11-10 MED FILL — LOSARTAN POTASSIUM 50 MG TA: 50 | 30 days supply | Qty: 30 | Fill #0

## 2015-11-10 MED FILL — ESOMEPRAZOLE MAG DR 40 MG C: 40 | 30 days supply | Qty: 30 | Fill #0

## 2015-11-10 MED FILL — levoFLOXacin 750 MG TABS: 750 | 10 days supply | Qty: 10 | Fill #0

## 2015-11-10 MED FILL — HYDROCODONE-HOMATROPINE SYR: 5-1.5 | 8 days supply | Qty: 120 | Fill #0

## 2015-11-10 NOTE — Patient Instructions (Addendum)
Take Nexium daily. 1 g IM Rocephin given. Levaquin 750 mg daily for 10 days. Sterapred DS 10 mg 12 day dosepak. Hycodan as needed for cough. Rest and drink plenty of fluids. If cough does not improve, refer to pulmonologist. Continue Ventolin inhaler.  Stop Ramapril. Start losartan 50 mg daily and return in 2-3 weeks for blood pressure check. CBC with differential pending.

## 2015-11-10 NOTE — Telephone Encounter (Signed)
Left message on VM asking pt to return my call to discuss message from Dr. Renold Genta below:  Per verbal from Dr. Renold Genta, pt needs to stop Altace (she sent in new medication) and start Losartan because Altace causes cough. She has also sent in Nexium and pt needs to return in 3 weeks to check his BP on the new medication.

## 2015-11-10 NOTE — Progress Notes (Signed)
   Subjective:    Patient ID: Benjamin Lopez, male    DOB: [birth year], 50 y.o.   MRN: [id number]  HPI Patient was just here last week complaining of musculoskeletal pain in chest and upper back which has improved with Flexeril. A chest x-ray showed a stable granuloma. He and wife have now come down with another apparent respiratory infection. Says he felt like he had fever over the weekend. Is coughing a lot. Issues with coughing started back in late December. Initially treated with Biaxin and Hycodan and subsequently switched to Levaquin. Has had persistent coughing since then.    Review of Systems     Objective:   Physical Exam  Skin warm and dry. Nodes none. Pharynx is clear. Neck is supple without adenopathy. Chest clear to auscultation. TMs are clear. Pharynx is clear.      Assessment & Plan:  Bronchitis  Chronic cough-stop Ramapril and switch to losartan  Plan: Rocephin 1 g IM. Levaquin 750 mg daily for 10 days. Ventolin inhaler 2 sprays 4 times daily. Take Nexium for possible GE reflux. Hycodan as needed for cough. Sterapred 10 mg 12 day dosepak. If symptoms persist, see pulmonologist. CBC with differential pending. Losartan 50 mg daily and return for office visit blood pressure check in 2-3 weeks.

## 2015-11-11 NOTE — Telephone Encounter (Signed)
Patient notified

## 2015-11-11 NOTE — Telephone Encounter (Signed)
Left message for return call.

## 2015-11-13 NOTE — Progress Notes (Signed)
   Subjective:    Patient ID: Benjamin Lopez, male    DOB: [birth year], 50 y.o.   MRN: [id number]  HPI Patient continues to have issues with cough and congestion. Has now come down with parathoracic pain. Says he's having difficulty moving due to musculoskeletal pain. Denies fever and chills. Had respiratory infection December 27 which never completely cleared and coughing has persisted.  Since he complained of some vague pain in his back,  we did an EKG today. He has slight 1 mm ST elevation in I, L, and V2  that  is is somewhat concerning. He has metabolic syndrome and strong family history of coronary artery disease in mother.  Review of Systems     Objective:   Physical Exam Palpable muscle spasm upper parathoracic muscle area. Chest clear to auscultation. Cardiac exam regular rate and rhythm normal S1 and S2 without rubs gallops or murmurs. Extremities without edema.       Assessment & Plan:  Protracted cough-since December 27  Musculoskeletal pain  Abnormal EKG  Plan: Chest x-ray ( PA and lateral) for protracted cough. Flexeril 10 mg at bedtime.  Addendum: Chest x-ray shows stable granuloma small right upper lobe noted in previous x-ray 2012 and no active disease. Refer to cardiology for evaluation. He has multiple risk factors for coronary disease including strong family history

## 2015-11-13 NOTE — Patient Instructions (Addendum)
Take Flexeril as directed for musculoskeletal pain. Have chest x-ray. Cardiology evaluation.

## 2015-11-18 DIAGNOSIS — J209 Acute bronchitis, unspecified: Secondary | ICD-10-CM

## 2015-11-30 ENCOUNTER — Other Ambulatory Visit: Payer: Self-pay | Admitting: Internal Medicine

## 2015-11-30 MED FILL — metFORMIN HCL 500 MG TABS: 500 | 90 days supply | Qty: 90 | Fill #0

## 2015-12-03 ENCOUNTER — Ambulatory Visit: Payer: 59 | Admitting: Internal Medicine

## 2015-12-03 ENCOUNTER — Ambulatory Visit (INDEPENDENT_AMBULATORY_CARE_PROVIDER_SITE_OTHER): Payer: 59 | Admitting: Internal Medicine

## 2015-12-03 ENCOUNTER — Ambulatory Visit: Payer: Commercial Managed Care - PPO | Admitting: Cardiovascular Disease

## 2015-12-03 ENCOUNTER — Encounter: Payer: Self-pay | Admitting: Internal Medicine

## 2015-12-03 VITALS — BP 130/84 | Resp 12 | Wt 225.0 lb

## 2015-12-03 DIAGNOSIS — R05 Cough: Secondary | ICD-10-CM | POA: Diagnosis not present

## 2015-12-03 DIAGNOSIS — I10 Essential (primary) hypertension: Secondary | ICD-10-CM

## 2015-12-03 DIAGNOSIS — R059 Cough, unspecified: Secondary | ICD-10-CM | POA: Insufficient documentation

## 2015-12-03 DIAGNOSIS — R053 Chronic cough: Secondary | ICD-10-CM

## 2015-12-03 MED ORDER — LOSARTAN POTASSIUM 100 MG PO TABS: 100.0000 mg | ORAL_TABLET | Freq: Every day | ORAL | Status: DC

## 2015-12-03 MED FILL — LOSARTAN POTASSIUM 100 MG T: 100 | 90 days supply | Qty: 90 | Fill #0

## 2015-12-03 NOTE — Progress Notes (Signed)
   Subjective:    Patient ID: Benjamin Lopez, male    DOB: 1966-04-29, 50 y.o.   MRN: [id number]  HPI At last visit we changed him from Ramipril to losartan 50 mg daily and he's here today to have blood pressure rechecked. Blood pressure is stable but I would like to see a little better control. Regarding increased losartan 100 mg daily and have him return for his regular schedule CPE May 4. His cough has improved. We don't know if it's because respiratory infection improved or Ramipril was causing cough. In the interim, wife  had acute appendicitis with subsequent appendectomy. She's doing well.  He is scheduled to have stress test next week. Strong family history of coronary disease.  He has a history of metabolic syndrome, hyperlipidemia, impaired glucose tolerance, hypertension. He needs to lose some weight.    Review of Systems     Objective:   Physical Exam Skin warm and dry. Chest clear to auscultation. Cardiac exam regular rate and rhythm. Extremities without edema.       Assessment & Plan:  Essential hypertension  Chronic cough-now off ACE and on ARB  Plan: Increase losartan 100 mg daily. Follow-up here May 4 for physical exam with fasting lab work. Try to diet and exercise. Stress test next week.

## 2015-12-03 NOTE — Patient Instructions (Signed)
Increase losartan 100 mg daily. Return May 4 for physical exam with fasting lab work on May 2.

## 2015-12-10 NOTE — Progress Notes (Signed)
Cardiology Office Note   Date:  12/12/2015   ID:  Benjamin Lopez, DOB 10-26-1965, MRN [id number]  PCP:  Elby Showers, MD  Cardiologist:   Sharol Harness, MD   Chief Complaint  Patient presents with  . New Patient (Initial Visit)    pt states no ight headedness or dizziness  . Chest Pain    no chest pain   . Shortness of Breath    no SOB  . Edema    no edema      History of Present Illness: Benjamin Lopez is a 50 y.o. male with hypertension and hyperlipidemia who presents for management of hypertension.  [name] has been working with his PCP Dr. Tedra Senegal, to control his hypertension.  [name] had a respiratory illness one month ago and had coughing spells.  He was treated with steroid packs and antibiotcs.  He was previously on ramipril, but due to the cough this was switched to losartan.  On 12/03/15 his losartan was increased for improved BP.  Since then he continues to have a mild cough but is otherwise well.  He denies chest pain, shortness of breath, lower extremity edema, orthopnea or PND.  [name] previously saw Dr. Haroldine Laws for palpitations.  He was seen in the ED after noting palpitations while at work.  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  He noted some palpitations and checked his heart rate, noting it was irregular.  In the ED he reportedly had a heart rate in the 120s. He was treated with IV fluids and his symptoms resolved.  He has not experienced any recurrent palpitations, lightheadedness or dizziness.  [name] has not been exercising much lately.  He has been busy with work, his kids and his mother, who has been sick.  He has a history of premature CAD.  Both his mother and maternal grandfather had MIs in their 38s. .  Past Medical History  Diagnosis Date  . Hyperlipidemia   . Hypertension   . Fracture of right clavicle 1981  . Fracture of tibia, right, closed 1983  . History of mononucleosis     at age 53  . 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 Fracture of fibula, right, closed 1998    Past Surgical History  Procedure Laterality Date  . Hydrocele excision / repair  1971    bilateral     Current Outpatient Prescriptions  Medication Sig Dispense Refill  . cyclobenzaprine (FLEXERIL) 10 MG tablet Take 1 tablet (10 mg total) by mouth 3 (three) times daily as needed for muscle spasms. 30 tablet 0  . esomeprazole (NEXIUM) 40 MG capsule Take 1 capsule (40 mg total) by mouth daily. 30 capsule 3  . fenofibrate (TRICOR) 145 MG tablet Take 1 tablet (145 mg total) by mouth daily. 90 tablet 1  . losartan (COZAAR) 100 MG tablet Take 1 tablet (100 mg total) by mouth daily. 90 tablet 3  . metFORMIN (GLUCOPHAGE) 500 MG tablet TAKE 1 TABLET BY MOUTH ONCE DAILY 90 tablet 0  . rosuvastatin (CRESTOR) 5 MG tablet Take 1 tablet (5 mg total) by mouth daily. 90 tablet 1  . SYNTHROID 112 MCG tablet TAKE 1 TABLET BY MOUTH DAILY BEFORE BREAKFAST. 90 tablet 1  . valACYclovir (VALTREX) 500 MG tablet TAKE 1 TABLET BY MOUTH TWICE DAILY 10 tablet 1   No current facility-administered medications for this visit.    Allergies:   Review of patient's  allergies indicates no known allergies.    Social History:  The patient  reports that he has never smoked. He has never used smokeless tobacco. He reports that he does not drink alcohol or use illicit drugs.   Family History:  The patient's family history includes CAD in his paternal grandfather; Cancer in his mother; Depression in his maternal aunt and maternal grandfather; Diabetes in his maternal aunt, maternal grandmother, mother, and paternal grandmother; Heart attack in his mother; Heart disease in his maternal grandfather; Hyperlipidemia in his maternal aunt, maternal grandfather, and mother; Hypertension in his mother.    ROS:  Please see the history of present illness.   Otherwise, review of systems are positive for none.   All other systems are reviewed and negative.     PHYSICAL EXAM: VS:  BP 128/88 mmHg  Pulse 77  Ht 5\' 10"  (1.778 m)  Wt 103.057 kg (227 lb 3.2 oz)  BMI 32.60 kg/m2 , BMI Body mass index is 32.6 kg/(m^2). GENERAL:  Well appearing HEENT:  Pupils equal round and reactive, fundi not visualized, oral mucosa unremarkable NECK:  No jugular venous distention, waveform within normal limits, carotid upstroke brisk and symmetric, no bruits, no thyromegaly LYMPHATICS:  No cervical adenopathy LUNGS:  Clear to auscultation bilaterally HEART:  RRR.  PMI not displaced or sustained,S1 and S2 within normal limits, no S3, no S4, no clicks, no rubs, no murmurs ABD:  Flat, positive bowel sounds normal in frequency in pitch, no bruits, no rebound, no guarding, no midline pulsatile mass, no hepatomegaly, no splenomegaly EXT:  2 plus pulses throughout, no edema, no cyanosis no clubbing SKIN:  No rashes no nodules NEURO:  Cranial nerves II through XII grossly intact, motor grossly intact throughout PSYCH:  Cognitively intact, oriented to person place and time   EKG:  EKG is ordered today. The ekg ordered today demonstrates sinus rhythm rate 77 bpm.     Recent Labs: 01/27/2015: BUN 15; Creat 0.86; Potassium 4.6; Sodium 140 08/04/2015: ALT 42; TSH 2.644 11/10/2015: Hemoglobin 15.1; Platelets 234    Lipid Panel    Component Value Date/Time   CHOL 185 08/04/2015 0906   TRIG 178* 08/04/2015 0906   HDL 32* 08/04/2015 0906   CHOLHDL 5.8* 08/04/2015 0906   VLDL 36* 08/04/2015 0906   LDLCALC 117 08/04/2015 0906      Wt Readings from Last 3 Encounters:  12/11/15 103.057 kg (227 lb 3.2 oz)  12/03/15 102.059 kg (225 lb)  11/10/15 101.152 kg (223 lb)      ASSESSMENT AND PLAN:  # Hypertension: Blood pressure well-controlled on losartan.  We will not make any changes at this time.   # Hyperlipidemia:  Lipids well-controlled.  Continue rosuvastatin and fenofibrate.  # Obesity: We discussed the importance of weight loss, especially given his family  history of CAD.  [name] will work on increasing his exercise to at least 30 minutes most days of the week.  He will also work on improving his diet.    Current medicines are reviewed at length with the patient today.  The patient does not have concerns regarding medicines.  The following changes have been made:  no change  Labs/ tests ordered today include:  No orders of the defined types were placed in this encounter.     Disposition:   FU with Vaanya Shambaugh C. Oval Linsey, MD, [facility] in 1 year.    This note was written with the assistance of speech recognition software.  Please excuse any transcriptional errors.  Signed, Josha Weekley C. Oval Linsey, MD, [facility]  12/12/2015 7:30 PM    [place]

## 2015-12-11 ENCOUNTER — Ambulatory Visit (INDEPENDENT_AMBULATORY_CARE_PROVIDER_SITE_OTHER): Payer: Commercial Managed Care - PPO | Admitting: Cardiovascular Disease

## 2015-12-11 ENCOUNTER — Encounter: Payer: Self-pay | Admitting: Cardiovascular Disease

## 2015-12-11 VITALS — BP 128/88 | HR 77 | Ht 70.0 in | Wt 227.2 lb

## 2015-12-11 DIAGNOSIS — E785 Hyperlipidemia, unspecified: Secondary | ICD-10-CM | POA: Diagnosis not present

## 2015-12-11 DIAGNOSIS — I10 Essential (primary) hypertension: Secondary | ICD-10-CM | POA: Diagnosis not present

## 2015-12-11 DIAGNOSIS — E669 Obesity, unspecified: Secondary | ICD-10-CM | POA: Diagnosis not present

## 2015-12-11 NOTE — Patient Instructions (Signed)

## 2015-12-12 ENCOUNTER — Encounter: Payer: Self-pay | Admitting: Cardiovascular Disease

## 2016-01-11 ENCOUNTER — Other Ambulatory Visit: Payer: Self-pay | Admitting: Internal Medicine

## 2016-01-11 MED FILL — ESOMEPRAZOLE MAG DR 40 MG C: 40 | 30 days supply | Qty: 30 | Fill #1

## 2016-01-11 MED FILL — SYNTHROID 112 MCG TABLET: 112 | 90 days supply | Qty: 90 | Fill #0

## 2016-02-02 ENCOUNTER — Other Ambulatory Visit: Payer: 59 | Admitting: Internal Medicine

## 2016-02-04 ENCOUNTER — Ambulatory Visit (INDEPENDENT_AMBULATORY_CARE_PROVIDER_SITE_OTHER): Payer: 59 | Admitting: Internal Medicine

## 2016-02-04 ENCOUNTER — Encounter: Payer: Self-pay | Admitting: Internal Medicine

## 2016-02-04 VITALS — BP 120/76 | HR 58 | Temp 97.6°F | Resp 18 | Wt 226.0 lb

## 2016-02-04 DIAGNOSIS — I10 Essential (primary) hypertension: Secondary | ICD-10-CM | POA: Diagnosis not present

## 2016-02-04 DIAGNOSIS — E8881 Metabolic syndrome: Secondary | ICD-10-CM

## 2016-02-04 DIAGNOSIS — Z0000 Encounter for general adult medical examination without abnormal findings: Secondary | ICD-10-CM

## 2016-02-04 DIAGNOSIS — E039 Hypothyroidism, unspecified: Secondary | ICD-10-CM | POA: Diagnosis not present

## 2016-02-04 DIAGNOSIS — E119 Type 2 diabetes mellitus without complications: Secondary | ICD-10-CM

## 2016-02-04 DIAGNOSIS — E669 Obesity, unspecified: Secondary | ICD-10-CM

## 2016-02-04 DIAGNOSIS — E785 Hyperlipidemia, unspecified: Secondary | ICD-10-CM | POA: Diagnosis not present

## 2016-02-04 DIAGNOSIS — Z125 Encounter for screening for malignant neoplasm of prostate: Secondary | ICD-10-CM | POA: Diagnosis not present

## 2016-02-04 LAB — POCT URINALYSIS DIPSTICK
BILIRUBIN UA: NEGATIVE
GLUCOSE UA: NEGATIVE
Ketones, UA: NEGATIVE
Leukocytes, UA: NEGATIVE
NITRITE UA: NEGATIVE
Protein, UA: NEGATIVE
RBC UA: NEGATIVE
Spec Grav, UA: 1.015
UROBILINOGEN UA: 0.2
pH, UA: 6

## 2016-02-04 LAB — LIPID PANEL
CHOL/HDL RATIO: 6.1 ratio — AB (ref <=5.0)
CHOLESTEROL: 194 mg/dL (ref 125–200)
HDL: 32 mg/dL — AB (ref >=40)
LDL Cholesterol: 119 mg/dL (ref <130)
TRIGLYCERIDES: 217 mg/dL — AB (ref <150)
VLDL: 43 mg/dL — AB (ref <30)

## 2016-02-04 LAB — CBC WITH DIFFERENTIAL/PLATELET
Basophils Absolute: 0 cells/uL (ref 0–200)
Basophils Relative: 0 %
EOS PCT: 1 %
Eosinophils Absolute: 45 cells/uL (ref 15–500)
HEMATOCRIT: 47.2 % (ref 38.5–50.0)
HEMOGLOBIN: 15.6 g/dL (ref 13.2–17.1)
LYMPHS ABS: 2115 {cells}/uL (ref 850–3900)
LYMPHS PCT: 47 %
MCH: 26.8 pg — AB (ref 27.0–33.0)
MCHC: 33.1 g/dL (ref 32.0–36.0)
MCV: 81 fL (ref 80.0–100.0)
MONO ABS: 315 {cells}/uL (ref 200–950)
MPV: 9.8 fL (ref 7.5–12.5)
Monocytes Relative: 7 %
Neutro Abs: 2025 cells/uL (ref 1500–7800)
Neutrophils Relative %: 45 %
Platelets: 260 10*3/uL (ref 140–400)
RBC: 5.83 MIL/uL — AB (ref 4.20–5.80)
RDW: 13.7 % (ref 11.0–15.0)
WBC: 4.5 10*3/uL (ref 3.8–10.8)

## 2016-02-04 LAB — COMPLETE METABOLIC PANEL WITH GFR
ALBUMIN: 4.6 g/dL (ref 3.6–5.1)
ALK PHOS: 54 U/L (ref 40–115)
ALT: 43 U/L (ref 9–46)
AST: 27 U/L (ref 10–40)
BUN: 13 mg/dL (ref 7–25)
CALCIUM: 9.8 mg/dL (ref 8.6–10.3)
CHLORIDE: 102 mmol/L (ref 98–110)
CO2: 27 mmol/L (ref 20–31)
Creat: 0.96 mg/dL (ref 0.60–1.35)
GFR, Est African American: >89 mL/min (ref >=60)
Glucose, Bld: 100 mg/dL — ABNORMAL HIGH (ref 65–99)
POTASSIUM: 4.2 mmol/L (ref 3.5–5.3)
Sodium: 140 mmol/L (ref 135–146)
Total Bilirubin: 0.6 mg/dL (ref 0.2–1.2)
Total Protein: 6.9 g/dL (ref 6.1–8.1)

## 2016-02-04 LAB — HEMOGLOBIN A1C
Hgb A1c MFr Bld: 6.2 % — ABNORMAL HIGH (ref <5.7)
MEAN PLASMA GLUCOSE: 131 mg/dL

## 2016-02-04 LAB — TSH: TSH: 2.15 mIU/L (ref 0.40–4.50)

## 2016-02-04 NOTE — Progress Notes (Signed)
   Subjective:    Patient ID: Benjamin Lopez, male    DOB: 1966/03/28, 50 y.o.   MRN: [id number]  HPI 50 year old White Male in today for health maintenance exam and evaluation of medical issues.History of hypertension, obesity, hyperlipidemia, hypothyroidism. History of mixed hyperlipidemia. At one point stopped statin therapy because of joint pain. Needs to take statin medication given family history of coronary disease in his mother. 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  He's gained 2 pounds since last year. Needs to lose weight and be under 200 pounds. 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   Social history: He is married. 2 daughters. Does not smoke. Social alcohol consumption. 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  Family history: Mother with history of coronary disease, impaired glucose tolerance colon cancer, diabetes mellitus, hypertension and hyperlipidemia as well as COPD and osteoarthritis. Father's family history is unknown. He is an only child. Maternal aunt has diabetes.  No known drug allergies  Past medical history: Fractured right clavicle 1981, fractured right tibia 1983, mononucleosis at age 9, poststreptococcal glomerular nephritis 1986, fractured right elbow 1998, bilateral hydrocele repair 1971. History of duplication of left collecting system.   Review of Systems noncontributory     Objective:   Physical Exam  Constitutional: He is oriented to person, place, and time. He appears well-developed and well-nourished. No distress.  HENT:  Head: Normocephalic and atraumatic.  Right Ear: External ear normal.  Left Ear: External ear normal.  Mouth/Throat: Oropharynx is clear and moist. No oropharyngeal exudate.  Eyes: Conjunctivae and EOM are normal. Pupils are equal, round, and reactive to light. Right eye exhibits no discharge. Left eye exhibits no discharge. No  scleral icterus.  Neck: Neck supple. No JVD present. No thyromegaly present.  Cardiovascular: Normal rate, regular rhythm and normal heart sounds.   No murmur heard. Pulmonary/Chest: Effort normal and breath sounds normal. He has no wheezes. He has no rales.  Abdominal: Bowel sounds are normal. He exhibits no distension and no mass. There is no tenderness. There is no rebound and no guarding.  Genitourinary: Prostate normal.  Musculoskeletal: He exhibits no edema.  Lymphadenopathy:    He has no cervical adenopathy.  Neurological: He is alert and oriented to person, place, and time. He has normal reflexes. No cranial nerve deficit.  Skin: Skin is warm and dry. No rash noted. He is not diaphoretic.  Psychiatric: He has a normal mood and affect. His behavior is normal. Judgment and thought content normal.  Vitals reviewed.         Assessment & Plan:  Normal health maintenance exam  Essential hypertension-stable on current regimen  Obesity-encouraged diet exercise and weight loss  Hyperlipidemia-triglycerides are elevated to 78. In 2016 we stopped Lipitor and TriCor and started Crestor. Patient needs to make sure he's taking lipid-lowering medication. Needs to diet and exercise.  Hypothyroidism- stable on thyroid replacement  Impaired glucose tolerance-stable at 6.2%  Plan: Encouraged diet exercise and weight loss. Return in 6 months. Continue same medications.

## 2016-02-05 LAB — MICROALBUMIN, URINE: MICROALB UR: 0.2 mg/dL

## 2016-02-05 LAB — PSA: PSA: 0.78 ng/mL (ref <=4.00)

## 2016-02-26 NOTE — Patient Instructions (Addendum)
Encouraged diet exercise and weight loss. Continue same medications. Please make sure you're taking  medications correctly and return in 6 months.

## 2016-03-03 ENCOUNTER — Encounter: Payer: Self-pay | Admitting: Cardiovascular Disease

## 2016-03-16 MED FILL — LOSARTAN POTASSIUM 100 MG T: 100 | 90 days supply | Qty: 90 | Fill #1

## 2016-03-21 ENCOUNTER — Other Ambulatory Visit: Payer: Self-pay

## 2016-03-21 MED ORDER — METFORMIN HCL 500 MG PO TABS: 500.0000 mg | ORAL_TABLET | Freq: Every day | ORAL | Status: DC

## 2016-03-21 MED ORDER — SYNTHROID 112 MCG PO TABS: ORAL_TABLET | ORAL | Status: DC

## 2016-03-21 MED FILL — metFORMIN HCL 500 MG TABS: 500 | 90 days supply | Qty: 90 | Fill #0

## 2016-04-24 ENCOUNTER — Ambulatory Visit (INDEPENDENT_AMBULATORY_CARE_PROVIDER_SITE_OTHER): Payer: 59 | Admitting: Internal Medicine

## 2016-04-24 VITALS — Temp 97.3°F

## 2016-04-24 DIAGNOSIS — W57XXXA Bitten or stung by nonvenomous insect and other nonvenomous arthropods, initial encounter: Secondary | ICD-10-CM

## 2016-04-24 DIAGNOSIS — T148 Other injury of unspecified body region: Secondary | ICD-10-CM

## 2016-04-24 DIAGNOSIS — L0100 Impetigo, unspecified: Secondary | ICD-10-CM

## 2016-04-25 ENCOUNTER — Encounter: Payer: Self-pay | Admitting: Internal Medicine

## 2016-04-25 NOTE — Progress Notes (Signed)
   Subjective:    Patient ID: Benjamin Lopez, male    DOB: [birth year], 50 y.o.   MRN: [id number]  HPI Patient called around 4 PM today saying that he was on vacation this past week and actually [place] the [place] the weekend of [place]. Has developed some vesicular lesions on his abdomen that he is concerned about. Doesn't recall any tick bites. No fever or chills. No headache. Worried about possible MRSA. Agreed to see him in the office acutely today.    Review of Systems     Objective:   Physical Exam He has some several small erythematous papular lesions on abdomen approximately 3 and all with central vesicular areas. Has small area on the left penis at the T up that also looks like an insect bite. These lesions look like insect bites on his abdomen is well. Has also another lesion on anterior leg.       Assessment & Plan:  Insect bites with probable early secondary infection  Plan: Bactroban ointment to these lesions twice daily. Bathe with Hibiclens for minimum of 2 weeks. Doxycycline 100 mg twice daily for 10 days.

## 2016-04-25 NOTE — Patient Instructions (Signed)
Bathe with Hibiclens daily for 2 weeks. Apply Bactroban ointment to lesions twice daily. Doxycycline 100 mg twice daily for 10 days.

## 2016-04-27 MED FILL — SYNTHROID 112 MCG TABLET: 112 | 90 days supply | Qty: 90 | Fill #0

## 2016-06-28 MED FILL — LOSARTAN POTASSIUM 100 MG T: 100 | 90 days supply | Qty: 90 | Fill #2

## 2016-08-01 ENCOUNTER — Other Ambulatory Visit: Payer: Self-pay | Admitting: Internal Medicine

## 2016-08-01 MED FILL — SYNTHROID 112 MCG TABLET: 112 | 90 days supply | Qty: 90 | Fill #1

## 2016-08-01 MED FILL — ROSUVASTATIN CALCIUM 5 MG T: 5 | 90 days supply | Qty: 90 | Fill #0

## 2016-08-09 ENCOUNTER — Ambulatory Visit: Payer: 59 | Admitting: Internal Medicine

## 2016-08-22 ENCOUNTER — Other Ambulatory Visit: Payer: Self-pay | Admitting: Internal Medicine

## 2016-08-22 ENCOUNTER — Telehealth: Payer: Self-pay | Admitting: Internal Medicine

## 2016-08-22 MED ORDER — VALACYCLOVIR HCL 500 MG PO TABS: 500.0000 mg | ORAL_TABLET | Freq: Two times a day (BID) | ORAL | 2 refills | Status: DC

## 2016-08-22 NOTE — Telephone Encounter (Signed)
[facility]alt[facility] [place] per patient wife request.

## 2016-08-22 NOTE — Telephone Encounter (Signed)
Has a bad fever blister and wants to know if we can call in some medication for him to his pharmacy.    Best # for contact [phone]   Pharmacy:  [facility] [place]

## 2016-08-22 NOTE — Telephone Encounter (Signed)
Call in Valtrex 500 mg twice daily x 5 days #10 with 2 refills

## 2016-08-22 NOTE — Telephone Encounter (Signed)
Valtrex sent to pharmacy.  Left message for patient.

## 2016-09-19 ENCOUNTER — Other Ambulatory Visit: Payer: Self-pay | Admitting: Internal Medicine

## 2016-10-14 MED FILL — SYNTHROID 112 MCG TABLET: 112 | 90 days supply | Qty: 90 | Fill #0

## 2016-10-14 MED FILL — ROSUVASTATIN CALCIUM 5 MG T: 5 | 90 days supply | Qty: 90 | Fill #0

## 2016-10-24 MED FILL — LOSARTAN POTASSIUM 100 MG T: 100 | 90 days supply | Qty: 90 | Fill #3

## 2017-01-09 ENCOUNTER — Ambulatory Visit (INDEPENDENT_AMBULATORY_CARE_PROVIDER_SITE_OTHER): Payer: 59 | Admitting: Internal Medicine

## 2017-01-09 ENCOUNTER — Encounter: Payer: Self-pay | Admitting: Internal Medicine

## 2017-01-09 VITALS — BP 128/88 | HR 80 | Temp 97.4°F | Ht 70.0 in | Wt 222.3 lb

## 2017-01-09 DIAGNOSIS — L237 Allergic contact dermatitis due to plants, except food: Secondary | ICD-10-CM

## 2017-01-09 DIAGNOSIS — L559 Sunburn, unspecified: Secondary | ICD-10-CM | POA: Diagnosis not present

## 2017-01-09 DIAGNOSIS — W57XXXA Bitten or stung by nonvenomous insect and other nonvenomous arthropods, initial encounter: Secondary | ICD-10-CM

## 2017-01-09 MED ORDER — DOXYCYCLINE HYCLATE 100 MG PO TABS: 100.0000 mg | ORAL_TABLET | Freq: Two times a day (BID) | ORAL | 0 refills | Status: DC

## 2017-01-09 MED ORDER — PREDNISONE 10 MG PO TABS: ORAL_TABLET | ORAL | 0 refills | Status: DC

## 2017-01-09 MED ORDER — TRIAMCINOLONE ACETONIDE 0.1 % EX CREA: 1.0000 "application " | TOPICAL_CREAM | Freq: Two times a day (BID) | CUTANEOUS | 0 refills | Status: DC

## 2017-01-09 MED FILL — TRIAMCINOLONE 0.1% CREAM: 0.1 | 20 days supply | Qty: 80 | Fill #0

## 2017-01-09 MED FILL — predniSONE 10 MG TABS: 10 | 6 days supply | Qty: 21 | Fill #0

## 2017-01-09 MED FILL — DOXYCYCLINE HYCLATE 100 MG: 100 | 10 days supply | Qty: 20 | Fill #0

## 2017-01-09 NOTE — Patient Instructions (Signed)
Take prednisone in tapering course as directed. Doxycycline 100 mg twice daily for 10 days for cellulitis behind right ear and history of tick bites. Triamcinolone cream to topical contact dermatitis lesions 3 times daily. Do not use on face.

## 2017-01-09 NOTE — Progress Notes (Signed)
   Subjective:    Patient ID: Benjamin Lopez, male    DOB: [birth year], 51 y.o.   MRN: [id number]  [place] Says he received multiple deer tick bites. He had some sunburn on his face and neck. However thinks he got into some poison ivy. He has some lesions right arm that are consistent with poison ivy. Also has irritation right postauricular area that looks like it could be an area of contact dermatitis or cellulitis.  Wife and children have had recent respiratory infections. He is not complaining of that. He is worried about Lyme disease. He has no fever or myalgias.    Review of Systems see above     Objective:   Physical Exam He has some scattered lesions on his arms that could be insect bites but there are linear lesions that are consistent with contact dermatitis that is poison ivy. His face and neck are red from sunburn. He has an area behind right ear that is irritated and looks as if it could become weepy. Right TM is clear.       Assessment & Plan:  Contact dermatitis  Tick bites  Plan: He could have an early cellulitis right postauricular area. I'm covering him with doxycycline 100 mg twice daily for 10 days. Triamcinolone cream to lesions on arms 3 times a day as needed. Sterapred DS 10 mg 6 day dosepak.

## 2017-01-10 ENCOUNTER — Other Ambulatory Visit: Payer: Self-pay | Admitting: Internal Medicine

## 2017-01-10 MED FILL — ROSUVASTATIN CALCIUM 5 MG T: 5 | 90 days supply | Qty: 90 | Fill #1

## 2017-01-10 MED FILL — SYNTHROID 112 MCG TABLET: 112 | 90 days supply | Qty: 90 | Fill #1

## 2017-01-11 MED FILL — LOSARTAN POTASSIUM 100 MG T: 100 | 90 days supply | Qty: 90 | Fill #0

## 2017-02-10 IMAGING — CR DG CHEST 2V
2 series · 2 of 2 positions shown · non-contrast
Comparison: 11/05/2010.

CLINICAL DATA: One-month cough.

EXAM:
CHEST  2 VIEW

[w chest pa]
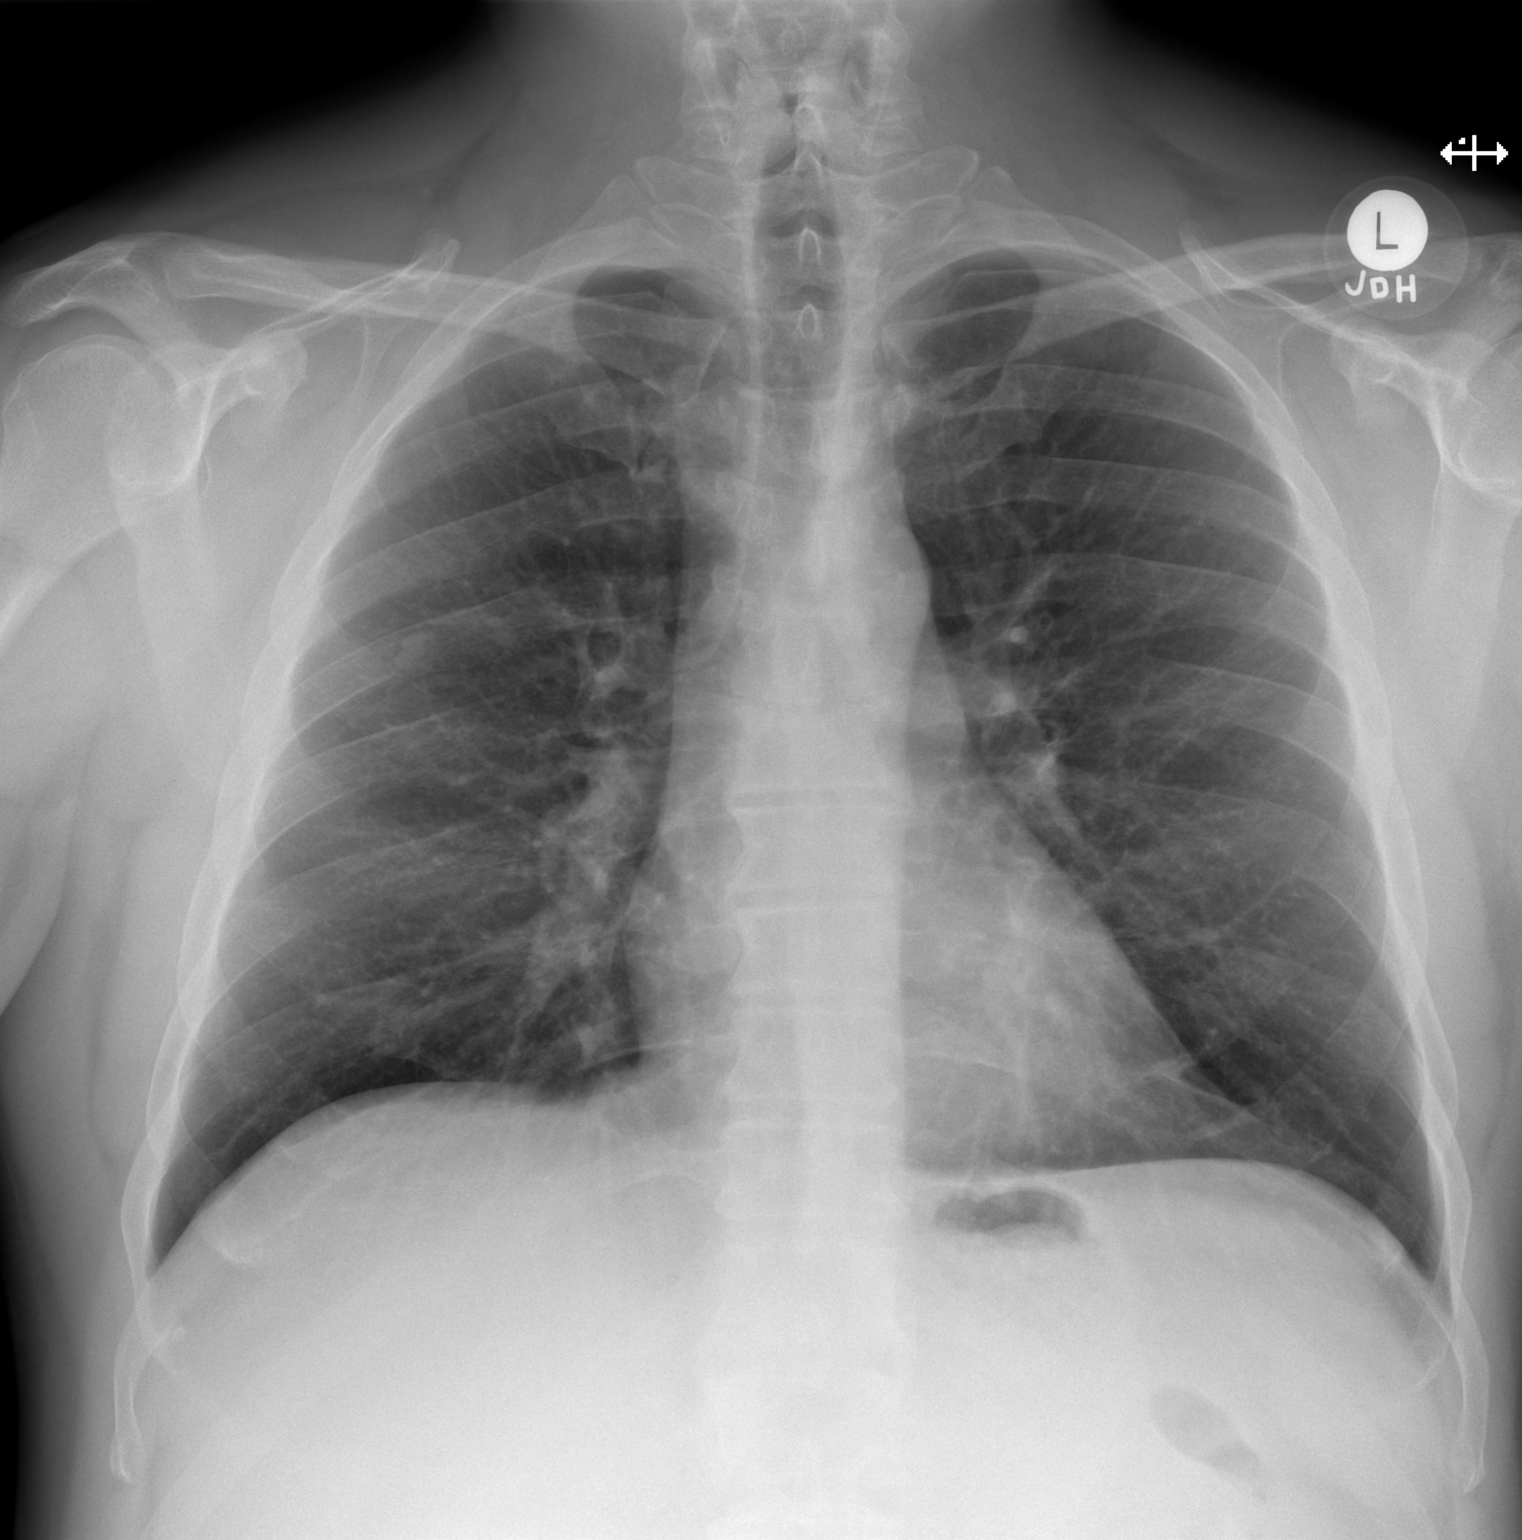

[w chest lat]
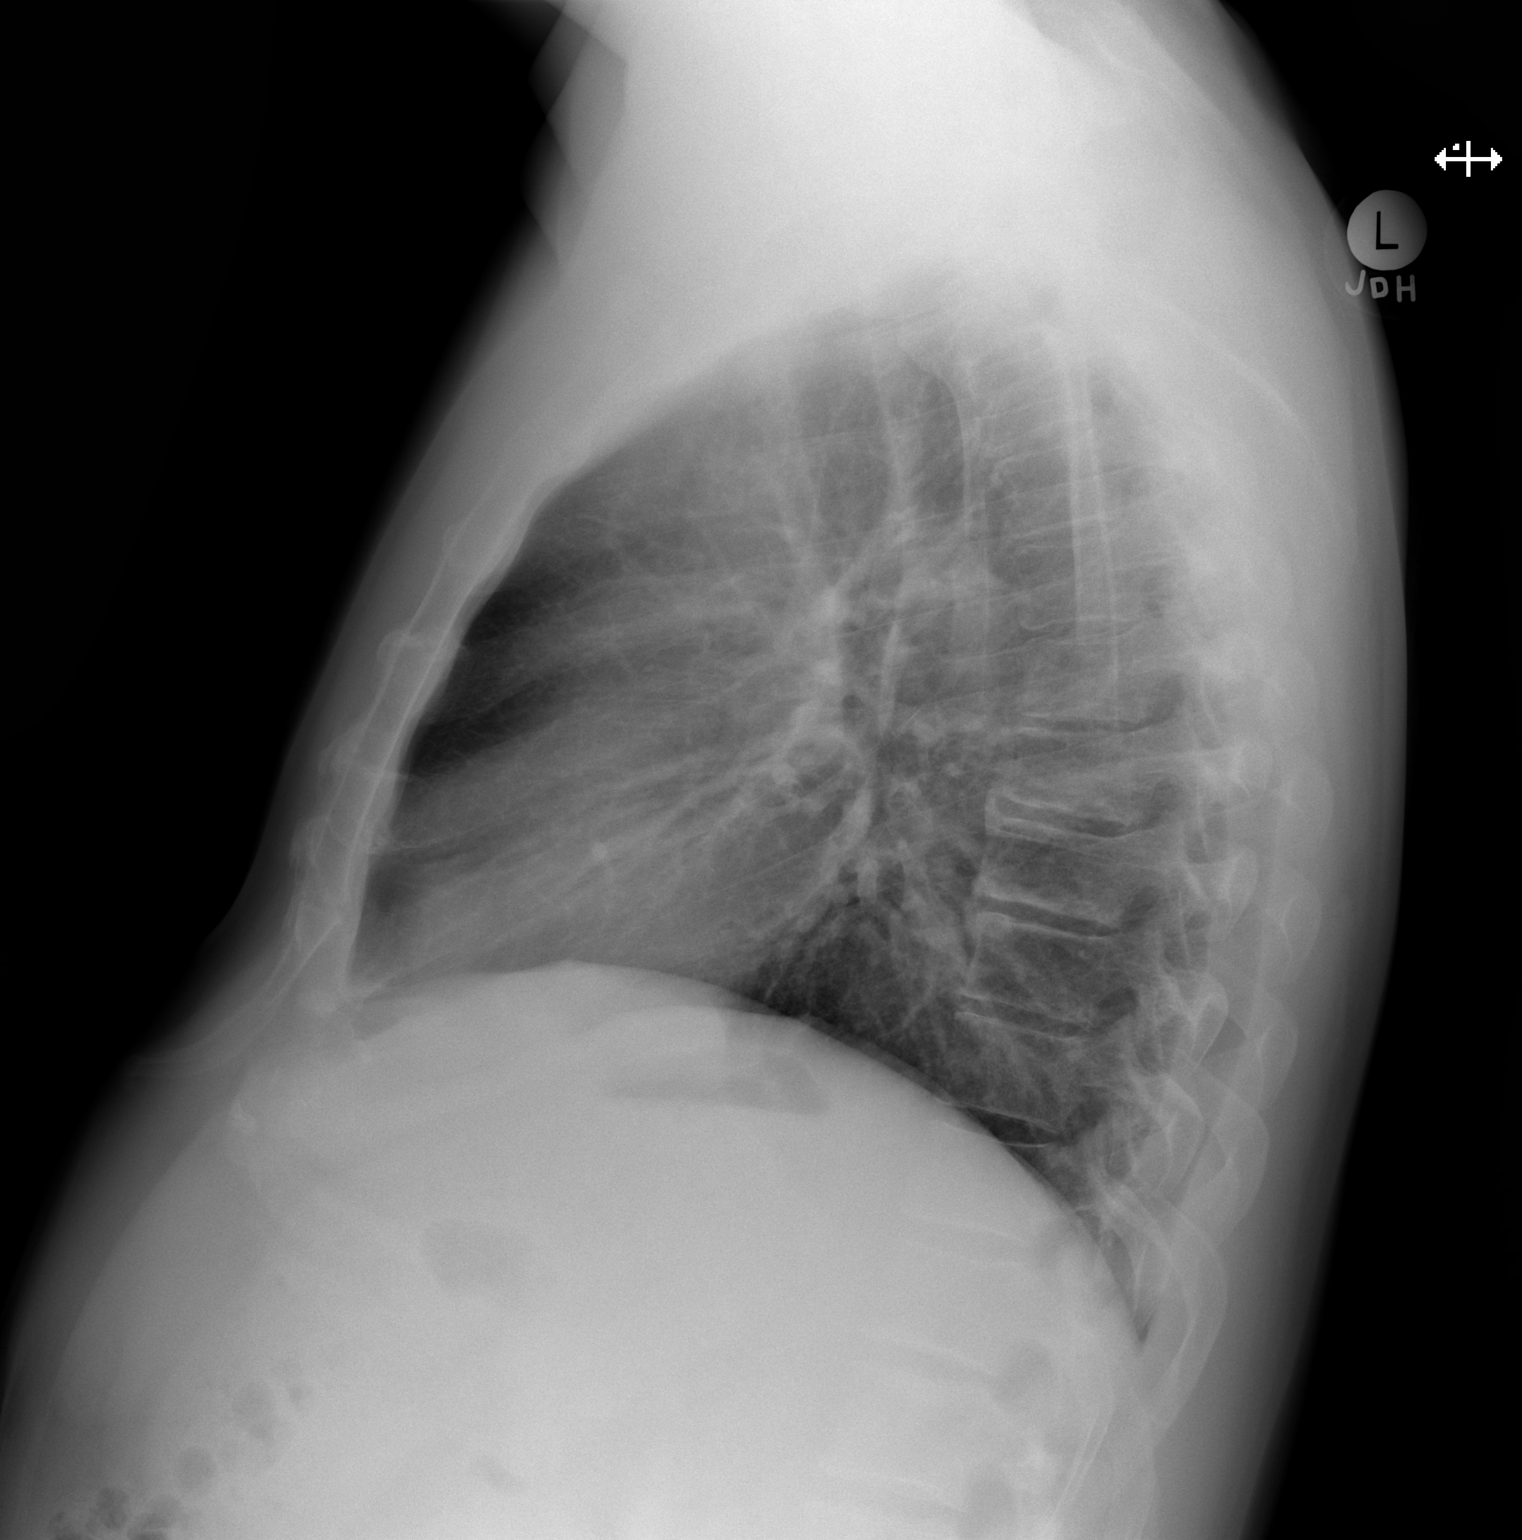

[2 of 2 positions shown; findings below may reference images not displayed]

FINDINGS: Mediastinum hilar structures normal. Lungs are clear. Previously
identified pulmonary nodule right upper lobe is unchanged. This most
likely a granuloma. No pleural effusion or pneumothorax. No acute
bony abnormality.

Heart size normal .
IMPRESSION: 1. No acute cardiopulmonary disease.
2. Small right upper lobe pulmonary nodule is unchanged and most
likely granuloma. Chest is stable from 11/05/2010.

## 2017-03-09 ENCOUNTER — Other Ambulatory Visit: Payer: 59 | Admitting: Internal Medicine

## 2017-03-14 ENCOUNTER — Encounter: Payer: 59 | Admitting: Internal Medicine

## 2017-03-14 ENCOUNTER — Other Ambulatory Visit: Payer: Self-pay | Admitting: Internal Medicine

## 2017-03-14 ENCOUNTER — Other Ambulatory Visit: Payer: 59 | Admitting: Internal Medicine

## 2017-03-14 DIAGNOSIS — E119 Type 2 diabetes mellitus without complications: Secondary | ICD-10-CM | POA: Diagnosis not present

## 2017-03-14 DIAGNOSIS — E039 Hypothyroidism, unspecified: Secondary | ICD-10-CM | POA: Diagnosis not present

## 2017-03-14 DIAGNOSIS — E785 Hyperlipidemia, unspecified: Secondary | ICD-10-CM

## 2017-03-14 DIAGNOSIS — Z0000 Encounter for general adult medical examination without abnormal findings: Secondary | ICD-10-CM

## 2017-03-14 DIAGNOSIS — Z130 Encounter for screening for diseases of the blood and blood-forming organs and certain disorders involving the immune mechanism: Secondary | ICD-10-CM

## 2017-03-14 DIAGNOSIS — I159 Secondary hypertension, unspecified: Secondary | ICD-10-CM | POA: Diagnosis not present

## 2017-03-14 DIAGNOSIS — Z125 Encounter for screening for malignant neoplasm of prostate: Secondary | ICD-10-CM | POA: Diagnosis not present

## 2017-03-14 LAB — LIPID PANEL
CHOL/HDL RATIO: 5.2 ratio — AB (ref <5.0)
CHOLESTEROL: 161 mg/dL (ref <200)
HDL: 31 mg/dL — ABNORMAL LOW (ref >40)
LDL Cholesterol: 93 mg/dL (ref <100)
TRIGLYCERIDES: 187 mg/dL — AB (ref <150)
VLDL: 37 mg/dL — AB (ref <30)

## 2017-03-14 LAB — CBC WITH DIFFERENTIAL/PLATELET
BASOS PCT: 0 %
Basophils Absolute: 0 cells/uL (ref 0–200)
EOS PCT: 1 %
Eosinophils Absolute: 50 cells/uL (ref 15–500)
HCT: 47 % (ref 38.5–50.0)
HEMOGLOBIN: 16.2 g/dL (ref 13.2–17.1)
LYMPHS ABS: 2150 {cells}/uL (ref 850–3900)
Lymphocytes Relative: 43 %
MCH: 27.7 pg (ref 27.0–33.0)
MCHC: 34.5 g/dL (ref 32.0–36.0)
MCV: 80.5 fL (ref 80.0–100.0)
MPV: 9.5 fL (ref 7.5–12.5)
Monocytes Absolute: 400 cells/uL (ref 200–950)
Monocytes Relative: 8 %
NEUTROS ABS: 2400 {cells}/uL (ref 1500–7800)
Neutrophils Relative %: 48 %
Platelets: 231 10*3/uL (ref 140–400)
RBC: 5.84 MIL/uL — ABNORMAL HIGH (ref 4.20–5.80)
RDW: 13.9 % (ref 11.0–15.0)
WBC: 5 10*3/uL (ref 3.8–10.8)

## 2017-03-14 LAB — COMPLETE METABOLIC PANEL WITH GFR
ALBUMIN: 4.3 g/dL (ref 3.6–5.1)
ALK PHOS: 55 U/L (ref 40–115)
ALT: 48 U/L — ABNORMAL HIGH (ref 9–46)
AST: 28 U/L (ref 10–40)
BILIRUBIN TOTAL: 0.8 mg/dL (ref 0.2–1.2)
BUN: 14 mg/dL (ref 7–25)
CO2: 25 mmol/L (ref 20–31)
Calcium: 9.6 mg/dL (ref 8.6–10.3)
Chloride: 104 mmol/L (ref 98–110)
Creat: 0.97 mg/dL (ref 0.60–1.35)
GFR, Est African American: >89 mL/min (ref >=60)
GFR, Est Non African American: >89 mL/min (ref >=60)
GLUCOSE: 101 mg/dL — AB (ref 65–99)
POTASSIUM: 4.3 mmol/L (ref 3.5–5.3)
SODIUM: 139 mmol/L (ref 135–146)
TOTAL PROTEIN: 7.1 g/dL (ref 6.1–8.1)

## 2017-03-14 LAB — TSH: TSH: 1.31 mIU/L (ref 0.40–4.50)

## 2017-03-15 LAB — MICROALBUMIN / CREATININE URINE RATIO
Creatinine, Urine: 42 mg/dL (ref 20–370)
Microalb, Ur: <0.2 mg/dL

## 2017-03-15 LAB — HEMOGLOBIN A1C
Hgb A1c MFr Bld: 6.1 % — ABNORMAL HIGH (ref <5.7)
Mean Plasma Glucose: 128 mg/dL

## 2017-03-17 ENCOUNTER — Encounter: Payer: Self-pay | Admitting: Internal Medicine

## 2017-03-17 ENCOUNTER — Ambulatory Visit (INDEPENDENT_AMBULATORY_CARE_PROVIDER_SITE_OTHER): Payer: 59 | Admitting: Internal Medicine

## 2017-03-17 VITALS — BP 140/96 | HR 92 | Temp 98.5°F | Ht 70.0 in | Wt 228.0 lb

## 2017-03-17 DIAGNOSIS — I10 Essential (primary) hypertension: Secondary | ICD-10-CM

## 2017-03-17 DIAGNOSIS — E782 Mixed hyperlipidemia: Secondary | ICD-10-CM

## 2017-03-17 DIAGNOSIS — E8881 Metabolic syndrome: Secondary | ICD-10-CM | POA: Diagnosis not present

## 2017-03-17 DIAGNOSIS — E039 Hypothyroidism, unspecified: Secondary | ICD-10-CM | POA: Diagnosis not present

## 2017-03-17 DIAGNOSIS — B009 Herpesviral infection, unspecified: Secondary | ICD-10-CM | POA: Diagnosis not present

## 2017-03-17 DIAGNOSIS — E119 Type 2 diabetes mellitus without complications: Secondary | ICD-10-CM | POA: Insufficient documentation

## 2017-03-17 DIAGNOSIS — R7302 Impaired glucose tolerance (oral): Secondary | ICD-10-CM | POA: Diagnosis not present

## 2017-03-17 DIAGNOSIS — Z0000 Encounter for general adult medical examination without abnormal findings: Secondary | ICD-10-CM

## 2017-03-17 DIAGNOSIS — Z6832 Body mass index (BMI) 32.0-32.9, adult: Secondary | ICD-10-CM | POA: Diagnosis not present

## 2017-03-17 DIAGNOSIS — E786 Lipoprotein deficiency: Secondary | ICD-10-CM | POA: Diagnosis not present

## 2017-03-17 LAB — POCT URINALYSIS DIPSTICK
BILIRUBIN UA: NEGATIVE
Blood, UA: NEGATIVE
Glucose, UA: NEGATIVE
KETONES UA: NEGATIVE
LEUKOCYTES UA: NEGATIVE
Nitrite, UA: NEGATIVE
PH UA: 7 (ref 5.0–8.0)
Spec Grav, UA: 1.015 (ref 1.010–1.025)
Urobilinogen, UA: 0.2 E.U./dL

## 2017-03-17 MED ORDER — AMLODIPINE BESYLATE 5 MG PO TABS: 5.0000 mg | ORAL_TABLET | Freq: Every day | ORAL | 0 refills | Status: DC

## 2017-03-17 MED FILL — AMLODIPINE BESYLATE 5 MG TA: 5 | 30 days supply | Qty: 30 | Fill #0
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
## 2017-03-18 LAB — PSA: PSA: 0.6 ng/mL (ref <=4.0)

## 2017-03-18 NOTE — Patient Instructions (Addendum)
Please continue diet exercise and weight loss efforts. Have added amlodipine to antihypertensive regimen and follow-up expected in 3-4 weeks.

## 2017-04-11 DIAGNOSIS — D2339 Other benign neoplasm of skin of other parts of face: Secondary | ICD-10-CM | POA: Diagnosis not present

## 2017-04-11 DIAGNOSIS — D485 Neoplasm of uncertain behavior of skin: Secondary | ICD-10-CM | POA: Diagnosis not present

## 2017-04-11 DIAGNOSIS — D1801 Hemangioma of skin and subcutaneous tissue: Secondary | ICD-10-CM | POA: Diagnosis not present

## 2017-04-11 DIAGNOSIS — D2239 Melanocytic nevi of other parts of face: Secondary | ICD-10-CM | POA: Diagnosis not present

## 2017-04-27 ENCOUNTER — Ambulatory Visit (INDEPENDENT_AMBULATORY_CARE_PROVIDER_SITE_OTHER): Payer: 59 | Admitting: Internal Medicine

## 2017-04-27 ENCOUNTER — Encounter: Payer: Self-pay | Admitting: Internal Medicine

## 2017-04-27 VITALS — BP 116/80 | HR 65 | Temp 97.8°F | Wt 230.0 lb

## 2017-04-27 DIAGNOSIS — I10 Essential (primary) hypertension: Secondary | ICD-10-CM | POA: Diagnosis not present

## 2017-04-27 DIAGNOSIS — R7302 Impaired glucose tolerance (oral): Secondary | ICD-10-CM

## 2017-04-27 MED ORDER — FENOFIBRATE 145 MG PO TABS: 145.0000 mg | ORAL_TABLET | Freq: Every day | ORAL | 1 refills | Status: DC

## 2017-04-27 MED ORDER — AMLODIPINE BESYLATE 5 MG PO TABS: 5.0000 mg | ORAL_TABLET | Freq: Every day | ORAL | 1 refills | Status: DC

## 2017-04-27 MED FILL — AMLODIPINE BESYLATE 5 MG TA: 5 | 90 days supply | Qty: 90 | Fill #0

## 2017-04-27 MED FILL — FENOFIBRATE 145 MG TAB: 145 | 90 days supply | Qty: 90 | Fill #0

## 2017-04-27 NOTE — Progress Notes (Signed)
   Subjective:    Patient ID: Benjamin Lopez, male    DOB: 09-Aug-1966, 51 y.o.   MRN: [id number]  HPI Here for BP check.At last visit in June which was his health maintenance exam his blood pressure was 140/96. Amlodipine was added to her antihypertensive regimen. His blood pressure is much better today. He has no complaints on this new medication.    Review of Systems see above-still needs to lose weight     Objective:   Physical Exam Neck supple. Chest clear. Cardiac exam regular rate and rhythm normal S1 and S2. Extremities without edema.       Assessment & Plan:  Essential hypertension-blood pressure improved with addition of amlodipine to losartan 100 mg daily.  No issues with edema  Plan: Six-month recheck exam due in November. Will need fasting lipid panel, TSH, hemoglobin A1c and office visit.

## 2017-04-27 NOTE — Patient Instructions (Addendum)
Continue losartan 100 mg daily and Norvasc 5 mg daily. Return in November for six-month recheck

## 2017-05-05 ENCOUNTER — Encounter: Payer: Self-pay | Admitting: Internal Medicine

## 2017-05-05 ENCOUNTER — Ambulatory Visit (INDEPENDENT_AMBULATORY_CARE_PROVIDER_SITE_OTHER): Payer: 59 | Admitting: Internal Medicine

## 2017-05-05 VITALS — BP 122/80 | HR 75 | Temp 97.5°F | Wt 230.0 lb

## 2017-05-05 DIAGNOSIS — E8881 Metabolic syndrome: Secondary | ICD-10-CM | POA: Diagnosis not present

## 2017-05-05 DIAGNOSIS — E782 Mixed hyperlipidemia: Secondary | ICD-10-CM

## 2017-05-05 DIAGNOSIS — R140 Abdominal distension (gaseous): Secondary | ICD-10-CM | POA: Diagnosis not present

## 2017-05-05 DIAGNOSIS — M546 Pain in thoracic spine: Secondary | ICD-10-CM | POA: Diagnosis not present

## 2017-05-05 DIAGNOSIS — M545 Low back pain, unspecified: Secondary | ICD-10-CM

## 2017-05-05 DIAGNOSIS — I10 Essential (primary) hypertension: Secondary | ICD-10-CM

## 2017-05-05 DIAGNOSIS — R109 Unspecified abdominal pain: Secondary | ICD-10-CM | POA: Diagnosis not present

## 2017-05-05 DIAGNOSIS — M549 Dorsalgia, unspecified: Secondary | ICD-10-CM | POA: Diagnosis not present

## 2017-05-05 DIAGNOSIS — R7302 Impaired glucose tolerance (oral): Secondary | ICD-10-CM

## 2017-05-05 LAB — CBC WITH DIFFERENTIAL/PLATELET
BASOS ABS: 0 {cells}/uL (ref 0–200)
Basophils Relative: 0 %
EOS ABS: 56 {cells}/uL (ref 15–500)
EOS PCT: 1 %
HCT: 48 % (ref 38.5–50.0)
HEMOGLOBIN: 16.1 g/dL (ref 13.2–17.1)
Lymphocytes Relative: 44 %
Lymphs Abs: 2464 cells/uL (ref 850–3900)
MCH: 27.6 pg (ref 27.0–33.0)
MCHC: 33.5 g/dL (ref 32.0–36.0)
MCV: 82.2 fL (ref 80.0–100.0)
MPV: 9.6 fL (ref 7.5–12.5)
Monocytes Absolute: 392 cells/uL (ref 200–950)
Monocytes Relative: 7 %
NEUTROS PCT: 48 %
Neutro Abs: 2688 cells/uL (ref 1500–7800)
Platelets: 264 10*3/uL (ref 140–400)
RBC: 5.84 MIL/uL — ABNORMAL HIGH (ref 4.20–5.80)
RDW: 13.5 % (ref 11.0–15.0)
WBC: 5.6 10*3/uL (ref 3.8–10.8)

## 2017-05-05 MED ORDER — HYDROCODONE-ACETAMINOPHEN 10-325 MG PO TABS: 1.0000 | ORAL_TABLET | Freq: Four times a day (QID) | ORAL | 0 refills | Status: DC | PRN

## 2017-05-05 NOTE — Progress Notes (Signed)
   Subjective:    Patient ID: Benjamin Lopez, male    DOB: May 04, 1966, 51 y.o.   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 Doesn't recall any injury associated with onset of this pain. Describes pain in his lower lumbar area and also in his parathoracic spine area. He's had some radiation of pain around to his right upper quadrant. He's concerned about gallbladder disease. Has noticed some bloating of his abdomen. This is concerning to him because he has to work 12 hour shifts this weekend. No nausea and vomiting. No fever or shaking chills.    Review of Systems see above.     Objective:   Physical Exam His abdomen is obese soft nondistended without hepatosplenomegaly masses or significant tenderness.  He has some paralumbar muscle tenderness and some right parathoracic musculoskeletal pain. He moves all 4 extremities. Has no radiation of lower back pain to his legs.       Assessment & Plan:  Musculoskeletal pain parathoracic and paralumbar areas.  Abdominal bloating and discomfort  Plan: CBC with differential, amylase, lipase, liver functions. Have ultrasound of gallbladder in the near future. Hydrocodone/APAP 10/325 #20 one by mouth every 6 hours when necessary pain.

## 2017-05-05 NOTE — Patient Instructions (Signed)
To have ultrasound of the gallbladder in the near future. Lab work drawn and pending including amylase lipase and liver functions as well as CBC. Hydrocodone/APAP sparingly for pain.

## 2017-05-06 LAB — LIPASE: Lipase: 23 U/L (ref 7–60)

## 2017-05-06 LAB — HEPATIC FUNCTION PANEL
ALBUMIN: 4.7 g/dL (ref 3.6–5.1)
ALK PHOS: 57 U/L (ref 40–115)
ALT: 73 U/L — AB (ref 9–46)
AST: 38 U/L (ref 10–40)
BILIRUBIN TOTAL: 0.5 mg/dL (ref 0.2–1.2)
Bilirubin, Direct: 0.1 mg/dL (ref <=0.2)
Indirect Bilirubin: 0.4 mg/dL (ref 0.2–1.2)
TOTAL PROTEIN: 7.2 g/dL (ref 6.1–8.1)

## 2017-05-06 LAB — AMYLASE: AMYLASE: 31 U/L (ref 21–101)

## 2017-05-09 ENCOUNTER — Other Ambulatory Visit: Payer: Commercial Managed Care - PPO

## 2017-05-12 ENCOUNTER — Ambulatory Visit
Admission: RE | Admit: 2017-05-12 | Discharge: 2017-05-12 | Disposition: A | Discharge status: Home / Self Care | Payer: 59 | Source: Ambulatory Visit | Attending: Internal Medicine | Admitting: Internal Medicine

## 2017-05-12 DIAGNOSIS — R109 Unspecified abdominal pain: Secondary | ICD-10-CM

## 2017-05-12 DIAGNOSIS — K760 Fatty (change of) liver, not elsewhere classified: Secondary | ICD-10-CM | POA: Diagnosis not present

## 2017-05-15 ENCOUNTER — Encounter: Payer: Self-pay | Admitting: Internal Medicine

## 2017-05-15 ENCOUNTER — Ambulatory Visit (INDEPENDENT_AMBULATORY_CARE_PROVIDER_SITE_OTHER): Payer: 59 | Admitting: Internal Medicine

## 2017-05-15 VITALS — BP 124/88 | HR 78 | Temp 98.1°F | Wt 230.0 lb

## 2017-05-15 DIAGNOSIS — M546 Pain in thoracic spine: Secondary | ICD-10-CM

## 2017-05-15 MED ORDER — CYCLOBENZAPRINE HCL 10 MG PO TABS: 10.0000 mg | ORAL_TABLET | Freq: Every day | ORAL | 0 refills | Status: DC

## 2017-05-15 MED ORDER — MELOXICAM 15 MG PO TABS: 15.0000 mg | ORAL_TABLET | Freq: Every day | ORAL | 0 refills | Status: DC

## 2017-05-15 MED FILL — MELOXICAM 15 MG TABLET: 15 | 30 days supply | Qty: 30 | Fill #0

## 2017-05-15 MED FILL — CYCLOBENZAPRINE 10 MG TAB: 10 | 30 days supply | Qty: 30 | Fill #0

## 2017-05-15 NOTE — Patient Instructions (Signed)
Meloxicam 15 mg daily. Flexeril 10 mg one half tablet at bedtime. To see orthopedist later this week.

## 2017-05-15 NOTE — Progress Notes (Signed)
   Subjective:    Patient ID: Benjamin Lopez, male    DOB: [birth year], 51 y.o.   MRN: [id number]  HPI He was seen briefly on August 3 complaining of right parathoracic back pain radiating around to his right upper abdomen area. He was sent for ultrasound of the gallbladder that showed no evidence of gallstones. 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 He worked this past weekend and had to lift a couple of large patients. He's having some intermittent spasm in the right parathoracic muscle area.    Review of Systems see above     Objective:   Physical Exam  Minimal tenderness right parathoracic area just below right scapula. No significant trigger point.      Assessment & Plan:  Right parathoracic back pain  Plan: Flexeril 10 mg one half tablet at bedtime. Meloxicam 15 mg. To see orthopedist. May need physical therapy.

## 2017-05-19 DIAGNOSIS — M5441 Lumbago with sciatica, right side: Secondary | ICD-10-CM | POA: Diagnosis not present

## 2017-05-19 MED FILL — predniSONE 5 MG TABS: 5 | 6 days supply | Qty: 21 | Fill #0

## 2017-05-24 DIAGNOSIS — M545 Low back pain: Secondary | ICD-10-CM | POA: Diagnosis not present

## 2017-05-24 DIAGNOSIS — M546 Pain in thoracic spine: Secondary | ICD-10-CM | POA: Diagnosis not present

## 2017-05-30 ENCOUNTER — Other Ambulatory Visit: Payer: Self-pay | Admitting: Internal Medicine

## 2017-05-30 MED FILL — SYNTHROID 112 MCG TABLET: 112 | 90 days supply | Qty: 90 | Fill #2

## 2017-05-30 MED FILL — metFORMIN HCL 500 MG TABS: 500 | 90 days supply | Qty: 90 | Fill #0

## 2017-05-30 MED FILL — LOSARTAN POTASSIUM 100 MG T: 100 | 90 days supply | Qty: 90 | Fill #0

## 2017-06-02 DIAGNOSIS — M940 Chondrocostal junction syndrome [Tietze]: Secondary | ICD-10-CM | POA: Diagnosis not present

## 2017-07-19 ENCOUNTER — Encounter: Payer: Self-pay | Admitting: Internal Medicine

## 2017-08-17 DIAGNOSIS — D2339 Other benign neoplasm of skin of other parts of face: Secondary | ICD-10-CM | POA: Diagnosis not present

## 2017-08-17 DIAGNOSIS — D2239 Melanocytic nevi of other parts of face: Secondary | ICD-10-CM | POA: Diagnosis not present

## 2017-08-18 ENCOUNTER — Other Ambulatory Visit: Payer: 59 | Admitting: Internal Medicine

## 2017-08-21 ENCOUNTER — Ambulatory Visit: Payer: 59 | Admitting: Internal Medicine

## 2017-08-22 ENCOUNTER — Ambulatory Visit: Payer: 59 | Admitting: Internal Medicine

## 2017-08-29 ENCOUNTER — Other Ambulatory Visit (INDEPENDENT_AMBULATORY_CARE_PROVIDER_SITE_OTHER): Payer: 59 | Admitting: Internal Medicine

## 2017-08-29 DIAGNOSIS — I159 Secondary hypertension, unspecified: Secondary | ICD-10-CM

## 2017-08-29 DIAGNOSIS — E039 Hypothyroidism, unspecified: Secondary | ICD-10-CM | POA: Diagnosis not present

## 2017-08-29 DIAGNOSIS — E119 Type 2 diabetes mellitus without complications: Secondary | ICD-10-CM

## 2017-08-30 ENCOUNTER — Encounter: Payer: Self-pay | Admitting: Internal Medicine

## 2017-08-30 ENCOUNTER — Ambulatory Visit (INDEPENDENT_AMBULATORY_CARE_PROVIDER_SITE_OTHER): Payer: 59 | Admitting: Internal Medicine

## 2017-08-30 VITALS — BP 122/90 | HR 72 | Temp 98.1°F | Wt 234.0 lb

## 2017-08-30 DIAGNOSIS — R7302 Impaired glucose tolerance (oral): Secondary | ICD-10-CM

## 2017-08-30 DIAGNOSIS — E8881 Metabolic syndrome: Secondary | ICD-10-CM

## 2017-08-30 DIAGNOSIS — I10 Essential (primary) hypertension: Secondary | ICD-10-CM | POA: Diagnosis not present

## 2017-08-30 DIAGNOSIS — Z6833 Body mass index (BMI) 33.0-33.9, adult: Secondary | ICD-10-CM | POA: Diagnosis not present

## 2017-08-30 DIAGNOSIS — E782 Mixed hyperlipidemia: Secondary | ICD-10-CM | POA: Diagnosis not present

## 2017-08-30 DIAGNOSIS — B009 Herpesviral infection, unspecified: Secondary | ICD-10-CM | POA: Diagnosis not present

## 2017-08-30 LAB — HEPATIC FUNCTION PANEL
AG RATIO: 2 (calc) (ref 1.0–2.5)
ALBUMIN MSPROF: 4.7 g/dL (ref 3.6–5.1)
ALT: 76 U/L — ABNORMAL HIGH (ref 9–46)
AST: 42 U/L — ABNORMAL HIGH (ref 10–40)
Alkaline phosphatase (APISO): 65 U/L (ref 40–115)
BILIRUBIN DIRECT: 0.1 mg/dL (ref 0.0–0.2)
GLOBULIN: 2.3 g/dL (ref 1.9–3.7)
Indirect Bilirubin: 0.5 mg/dL (calc) (ref 0.2–1.2)
Total Bilirubin: 0.6 mg/dL (ref 0.2–1.2)
Total Protein: 7 g/dL (ref 6.1–8.1)

## 2017-08-30 LAB — LIPID PANEL
Cholesterol: 177 mg/dL (ref <200)
HDL: 35 mg/dL — ABNORMAL LOW (ref >40)
LDL CHOLESTEROL (CALC): 109 mg/dL — AB
NON-HDL CHOLESTEROL (CALC): 142 mg/dL — AB (ref <130)
Total CHOL/HDL Ratio: 5.1 (calc) — ABNORMAL HIGH (ref <5.0)
Triglycerides: 209 mg/dL — ABNORMAL HIGH (ref <150)

## 2017-08-30 LAB — HEMOGLOBIN A1C
EAG (MMOL/L): 7.3 (calc)
Hgb A1c MFr Bld: 6.2 % of total Hgb — ABNORMAL HIGH (ref <5.7)
Mean Plasma Glucose: 131 (calc)

## 2017-08-30 LAB — MICROALBUMIN / CREATININE URINE RATIO
Creatinine, Urine: 43 mg/dL (ref 20–320)
MICROALB UR: 0.2 mg/dL
MICROALB/CREAT RATIO: 5 ug/mg{creat} (ref <30)

## 2017-08-30 LAB — TSH: TSH: 2.9 m[IU]/L (ref 0.40–4.50)

## 2017-08-30 MED ORDER — HYDROCHLOROTHIAZIDE 25 MG PO TABS: 25.0000 mg | ORAL_TABLET | Freq: Every day | ORAL | 3 refills | Status: DC

## 2017-08-30 MED ORDER — VALACYCLOVIR HCL 500 MG PO TABS: 500.0000 mg | ORAL_TABLET | Freq: Two times a day (BID) | ORAL | 0 refills | Status: DC

## 2017-08-30 MED FILL — HYDROCHLOROTHIAZIDE 25 MG T: 25 | 30 days supply | Qty: 30 | Fill #0

## 2017-08-30 MED FILL — VALACYCLOVIR HCL 500 MG TAB: 500 | 5 days supply | Qty: 10 | Fill #0

## 2017-08-30 NOTE — Patient Instructions (Signed)
Please try to diet exercise and lose weight.  We may need to change hypertension regimen.  Follow-up in 6 months.
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
## 2017-09-14 ENCOUNTER — Other Ambulatory Visit: Payer: Self-pay | Admitting: Internal Medicine

## 2017-09-14 MED FILL — LOSARTAN POTASSIUM 100 MG T: 100 | 90 days supply | Qty: 90 | Fill #0

## 2017-09-20 ENCOUNTER — Other Ambulatory Visit: Payer: Self-pay | Admitting: Internal Medicine

## 2017-09-24 IMAGING — US US ABDOMEN LIMITED
1 series · 14 of 25 positions shown · non-contrast
Comparison: None.

CLINICAL DATA: Right upper quadrant and right back pain for 2
weeks. Bloating.

EXAM:
ULTRASOUND ABDOMEN LIMITED RIGHT UPPER QUADRANT

[Series 1: us abdomen limited · 0.22mm/px · 14 of 48 slices shown]
[im 1/48]
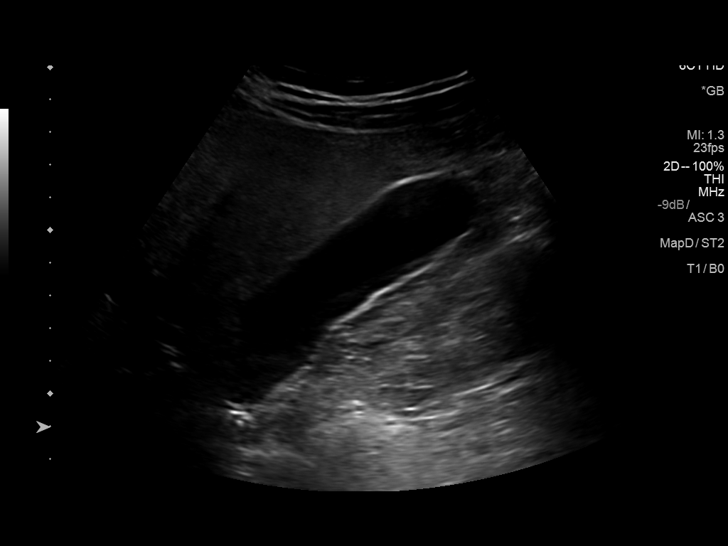
[im 4/48]
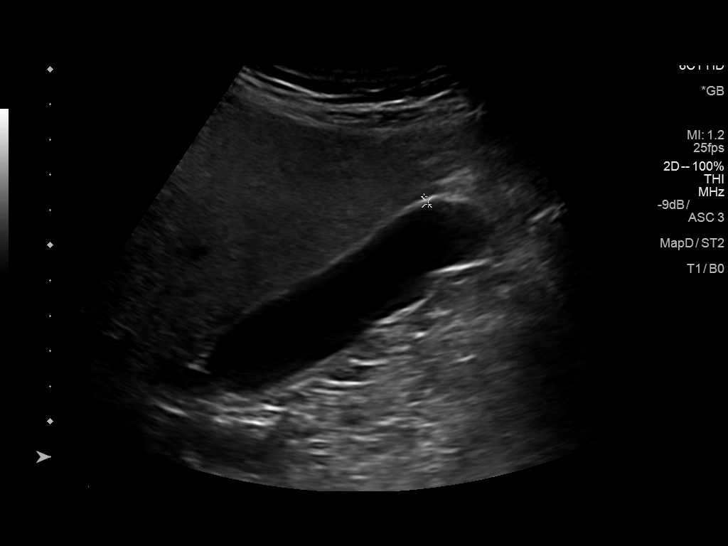
[im 8/48]
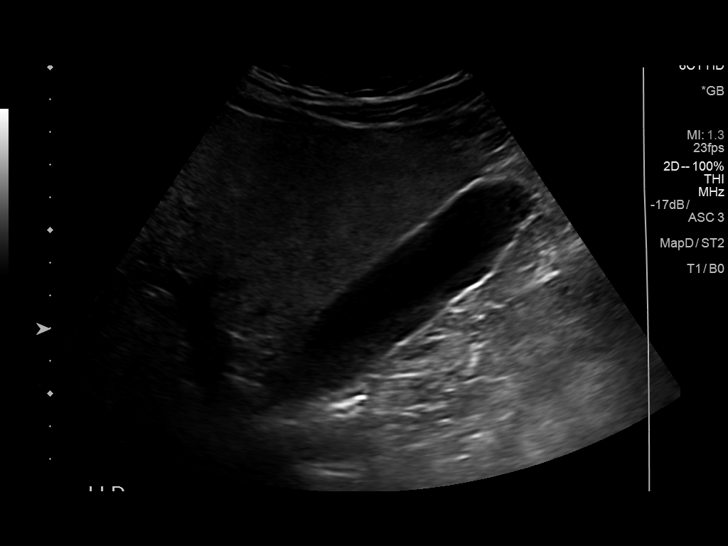
[im 12/48]
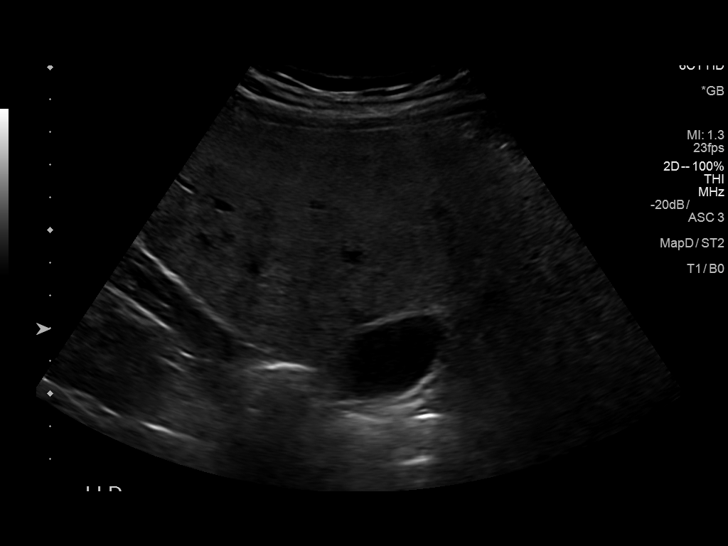
[im 16/48]
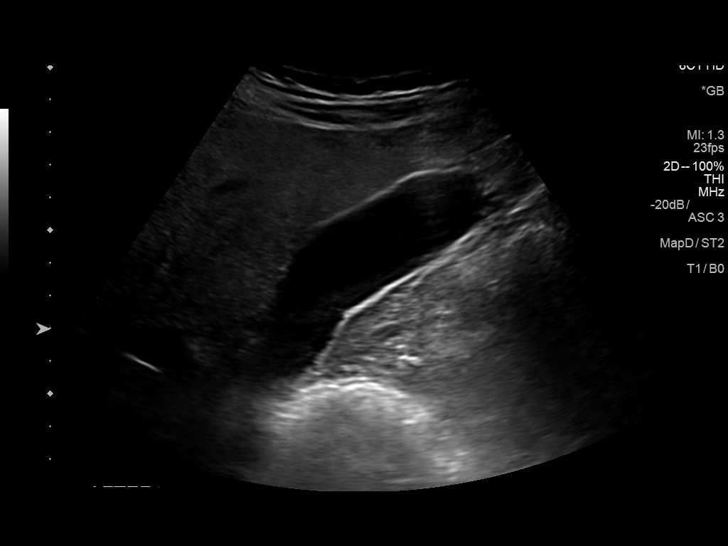
[im 18/48]
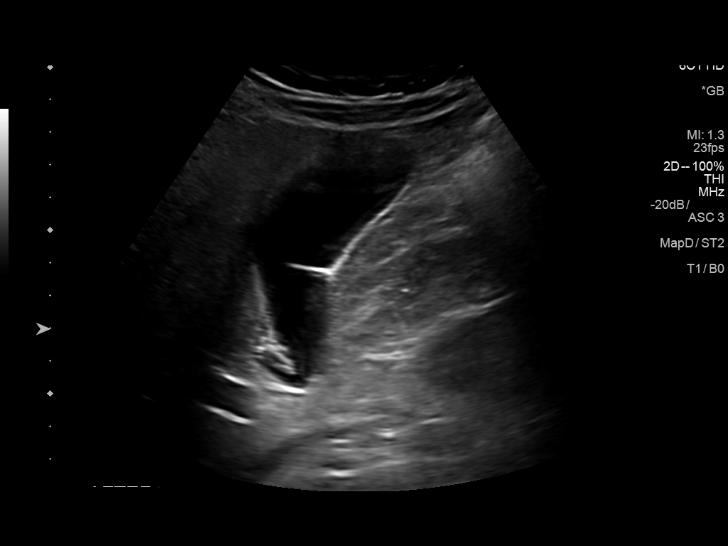
[im 22/48]
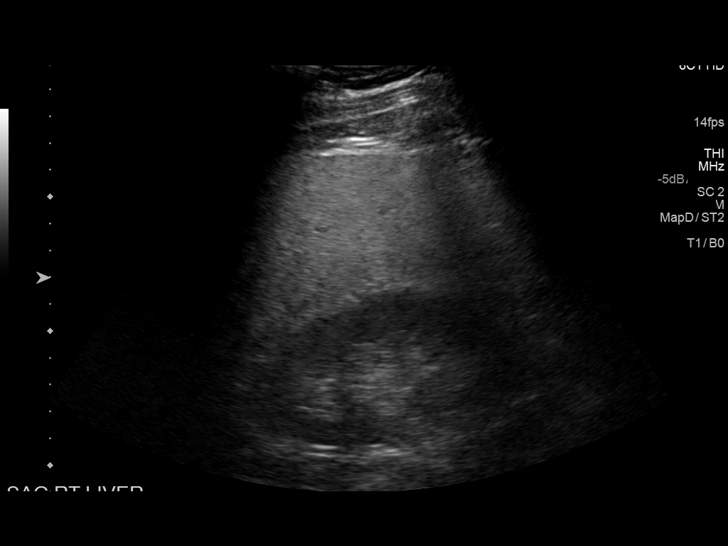
[im 26/48]
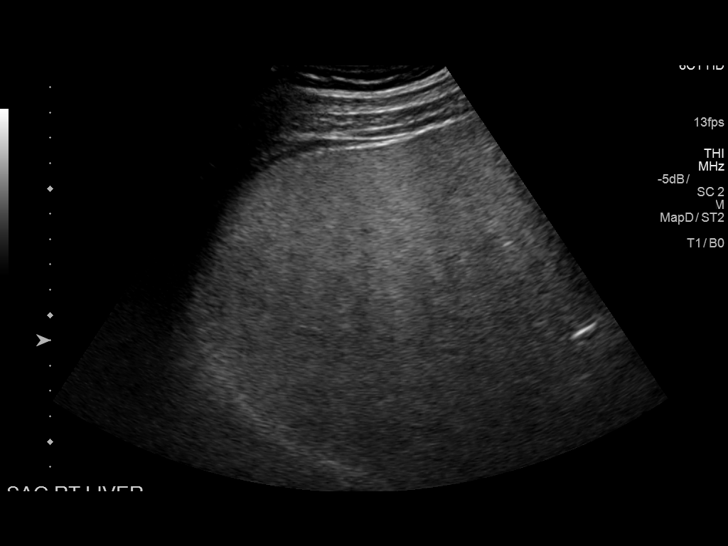
[im 30/48]
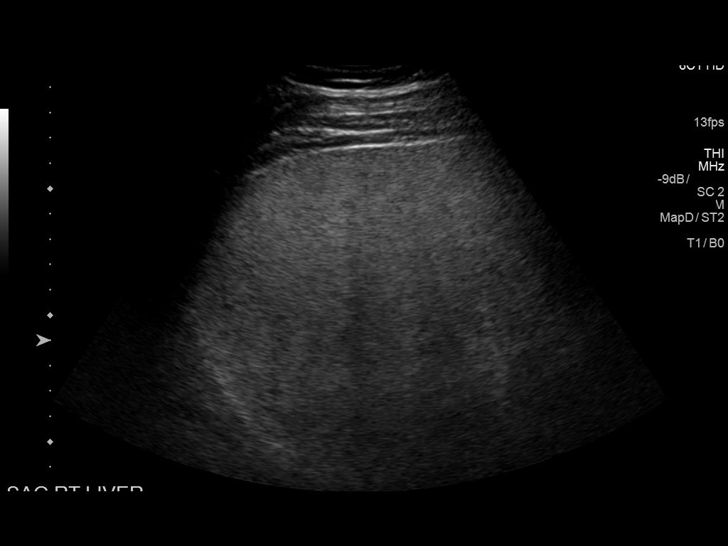
[im 32/48]
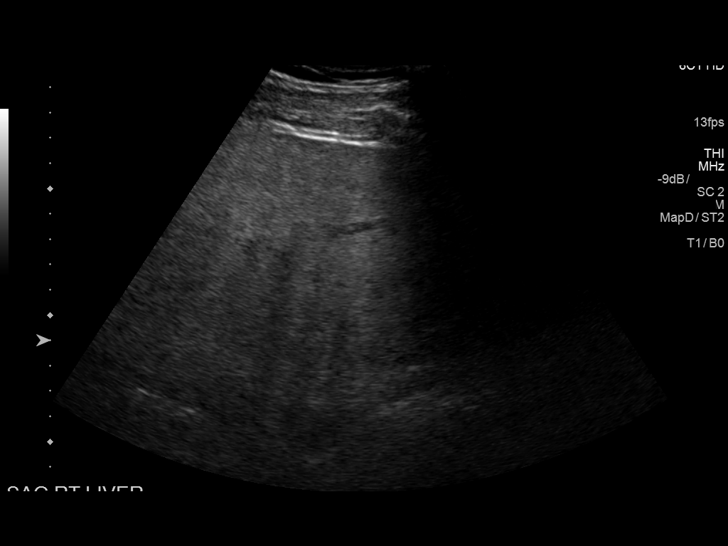
[im 36/48]
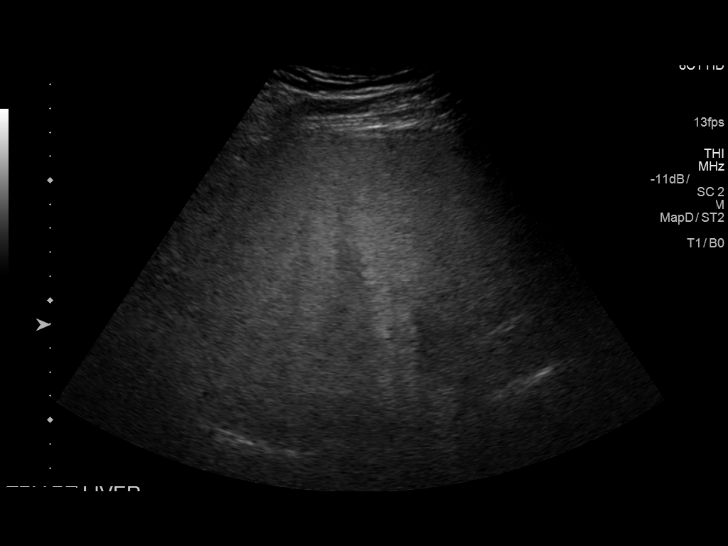
[im 40/48]
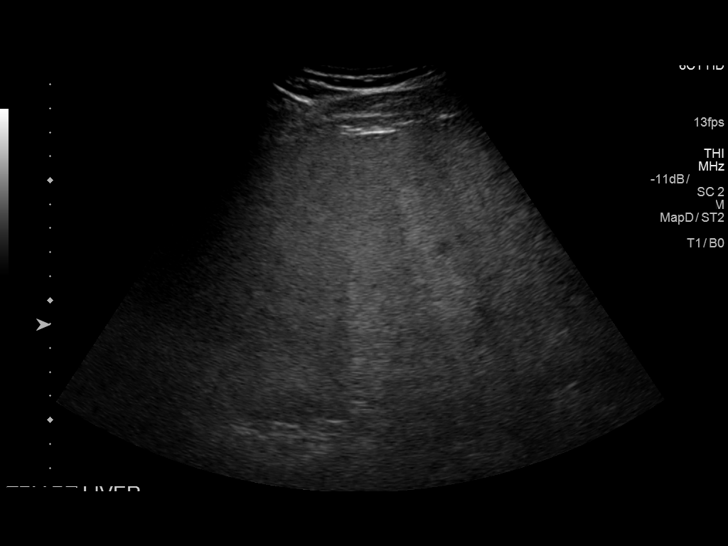
[im 44/48]
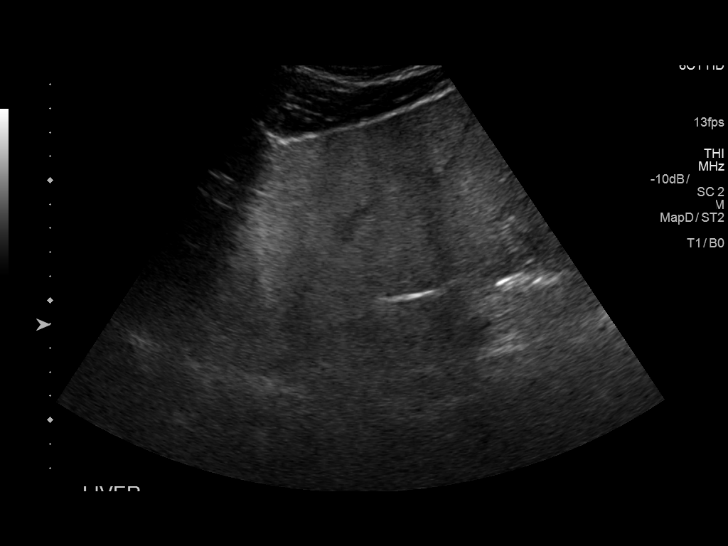
[im 48/48]
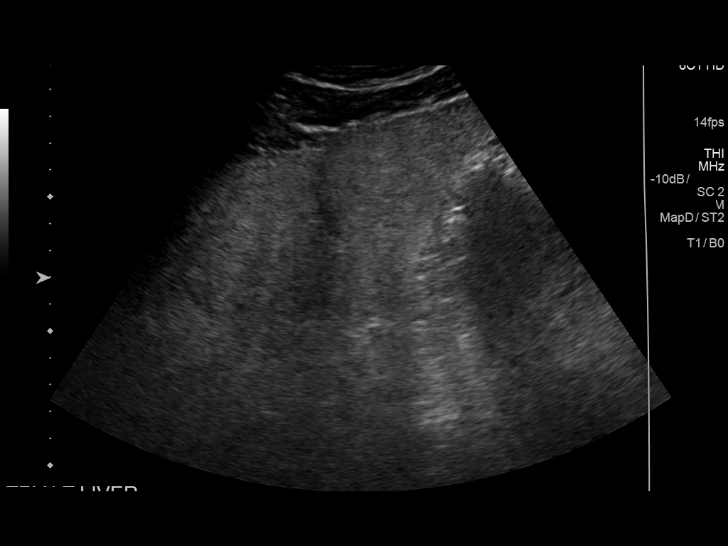

[14 of 25 positions shown; findings below may reference images not displayed]

FINDINGS: Gallbladder:

No gallstones or wall thickening visualized. No sonographic Murphy
sign noted by sonographer.

Common bile duct:

Diameter: 4 mm, within normal limits.

Liver:

Diffusely increased echogenicity of the hepatic parenchyma,
consistent with hepatic steatosis. No focal mass lesion identified.
Color Doppler shows patent portal vein with normal flow direction.
IMPRESSION: No evidence of gallstones or biliary ductal dilatation.

Diffuse hepatic steatosis.

## 2017-09-28 ENCOUNTER — Encounter: Payer: Self-pay | Admitting: Internal Medicine

## 2017-09-28 ENCOUNTER — Ambulatory Visit (INDEPENDENT_AMBULATORY_CARE_PROVIDER_SITE_OTHER): Payer: 59 | Admitting: Internal Medicine

## 2017-09-28 VITALS — Ht 70.0 in | Wt 232.0 lb

## 2017-09-28 DIAGNOSIS — I10 Essential (primary) hypertension: Secondary | ICD-10-CM

## 2017-09-28 LAB — POTASSIUM: POTASSIUM: 3.8 mmol/L (ref 3.5–5.3)

## 2017-09-28 MED ORDER — FENOFIBRATE 145 MG PO TABS: 145.0000 mg | ORAL_TABLET | Freq: Every day | ORAL | 1 refills | Status: DC

## 2017-09-28 MED ORDER — HYDROCHLOROTHIAZIDE 25 MG PO TABS: 25.0000 mg | ORAL_TABLET | Freq: Every day | ORAL | 3 refills | Status: DC

## 2017-09-28 MED ORDER — LOSARTAN POTASSIUM 100 MG PO TABS: 100.0000 mg | ORAL_TABLET | Freq: Every day | ORAL | 0 refills | Status: DC

## 2017-09-28 MED ORDER — AMLODIPINE BESYLATE 5 MG PO TABS: 5.0000 mg | ORAL_TABLET | Freq: Every day | ORAL | 1 refills | Status: DC

## 2017-09-28 MED FILL — FENOFIBRATE 145 MG TABLET: 145 | 90 days supply | Qty: 90 | Fill #0

## 2017-09-28 MED FILL — AMLODIPINE BESYLATE 5 MG TA: 5 | 90 days supply | Qty: 90 | Fill #0

## 2017-09-28 MED FILL — HYDROCHLOROTHIAZIDE 25 MG T: 25 | 90 days supply | Qty: 90 | Fill #0

## 2017-09-28 NOTE — Progress Notes (Addendum)
   Subjective:    Patient ID: Benjamin Lopez, male    DOB: 1966-04-30, 51 y.o.   MRN: [id number]  HPI He is here today for follow-up of blood pressure having added HCTZ at last visit.  Apparently ran out of amlodipine 2 days ago.  Diastolic is 90.  I would like to see it better than that.  It is hard to know what to do since he has been out of amlodipine for a couple of days.  Serum potassium drawn today on diuretic.    Review of Systems see above    Objective:   Physical Exam  Blood pressure 122/90.  Not examined today.      Assessment & Plan:  We will refill his medications.  Prescription written for home blood pressure cuff and I would like for him to take it at home and bring the readings in a month.  No changes in blood pressure regimen at this time. Serum potassium drawn on diuretic.

## 2017-09-28 NOTE — Progress Notes (Signed)
   Subjective:    Patient ID: Benjamin Lopez, male    DOB: 01/10/1966, 51 y.o.   MRN: [id number]  HPI    Review of Systems     Objective:   Physical Exam        Assessment & Plan:

## 2017-09-28 NOTE — Patient Instructions (Signed)
Please have amlodipine refill.  Please get home blood pressure monitor.  Return in early February for follow-up.

## 2017-10-11 ENCOUNTER — Other Ambulatory Visit: Payer: Self-pay | Admitting: Internal Medicine

## 2017-10-11 MED FILL — SYNTHROID 112 MCG TABLET: 112 | 90 days supply | Qty: 90 | Fill #0

## 2017-10-11 MED FILL — ROSUVASTATIN CALCIUM 5 MG T: 5 | 90 days supply | Qty: 90 | Fill #0

## 2017-11-07 ENCOUNTER — Encounter: Payer: Self-pay | Admitting: Internal Medicine

## 2017-11-07 ENCOUNTER — Ambulatory Visit: Payer: 59 | Admitting: Internal Medicine

## 2017-11-07 VITALS — BP 122/90 | HR 94 | Ht 70.0 in | Wt 235.0 lb

## 2017-11-07 DIAGNOSIS — I10 Essential (primary) hypertension: Secondary | ICD-10-CM | POA: Diagnosis not present

## 2017-11-07 DIAGNOSIS — Z6833 Body mass index (BMI) 33.0-33.9, adult: Secondary | ICD-10-CM

## 2017-11-07 MED ORDER — OLMESARTAN MEDOXOMIL 40 MG PO TABS: 40.0000 mg | ORAL_TABLET | Freq: Every day | ORAL | 1 refills | Status: DC

## 2017-11-07 MED FILL — OLMESARTAN MEDOXOMIL 40 MG: 40 | 30 days supply | Qty: 30 | Fill #0

## 2017-11-07 NOTE — Progress Notes (Signed)
   Subjective:    Patient ID: Benjamin Lopez, male    DOB: [birth year], 52 y.o.   MRN: [id number]  HPI Here for follow-up on hypertension.  At last visit, he had been on amlodipine but had run out of it.  Blood pressure was 122/90.  Prior to that we had started HCTZ 25 mg daily.  I would like to see his diastolic blood pressure improve.  Once again it is 122/90.  He is overweight at 235 pounds.  BMI is 33.72.  We talked about adding Lasix but he thinks that is going to interfere with his job with urinary frequency.  Instead of losartan we can try Benicar 40 mg daily.    Review of Systems see above     Objective:   Physical Exam  Spent 15 minutes speaking with patient.  See blood pressure above.      Assessment & Plan:  Essential hypertension  BMI 33  Impaired glucose tolerance  Hyperlipidemia  Plan: Benicar 40 mg daily, HCTZ 25 mg daily.  Follow-up March 7.  Hold amlodipine.

## 2017-11-14 ENCOUNTER — Other Ambulatory Visit: Payer: Self-pay | Admitting: Internal Medicine

## 2017-11-14 MED FILL — VALACYCLOVIR HCL 500 MG TAB: 500 | 5 days supply | Qty: 10 | Fill #0

## 2017-12-06 DIAGNOSIS — H524 Presbyopia: Secondary | ICD-10-CM | POA: Diagnosis not present

## 2017-12-06 DIAGNOSIS — H5213 Myopia, bilateral: Secondary | ICD-10-CM | POA: Diagnosis not present

## 2017-12-07 ENCOUNTER — Encounter: Payer: Self-pay | Admitting: Internal Medicine

## 2017-12-07 ENCOUNTER — Ambulatory Visit: Payer: 59 | Admitting: Internal Medicine

## 2017-12-07 VITALS — BP 98/70 | HR 68 | Ht 70.0 in | Wt 230.0 lb

## 2017-12-07 DIAGNOSIS — Z6833 Body mass index (BMI) 33.0-33.9, adult: Secondary | ICD-10-CM

## 2017-12-07 DIAGNOSIS — E8881 Metabolic syndrome: Secondary | ICD-10-CM

## 2017-12-07 DIAGNOSIS — I10 Essential (primary) hypertension: Secondary | ICD-10-CM | POA: Diagnosis not present

## 2017-12-07 NOTE — Progress Notes (Signed)
   Subjective:    Patient ID: Benjamin Lopez, male    DOB: 1965-12-19, 52 y.o.   MRN: [id number]  HPI He is now on Benicar 40 mg daily and HCTZ 25 mg daily.  He is here for follow-up on hypertension.  Amlodipine has been held.  Blood pressure is excellent today.  It is actually 98/78 he feels okay.  Benicar and HCTZ can be combined into one tab when he is ready for refill.    Review of Systems see above    Objective:   Physical Exam  Chest clear.  Cardiac exam regular rate and rhythm.      Assessment & Plan:  Essential hypertension  Plan: He will return in mid June for physical examination and follow-up.

## 2017-12-11 ENCOUNTER — Telehealth: Payer: Self-pay

## 2017-12-11 ENCOUNTER — Encounter: Payer: Self-pay | Admitting: Internal Medicine

## 2017-12-11 ENCOUNTER — Telehealth: Payer: Self-pay | Admitting: Internal Medicine

## 2017-12-11 MED ORDER — OLMESARTAN MEDOXOMIL-HCTZ 40-25 MG PO TABS: 1.0000 | ORAL_TABLET | Freq: Every day | ORAL | 1 refills | Status: DC

## 2017-12-11 MED FILL — OLMESARTAN-HCTZ 40-25 MG TA: 40-25 | 90 days supply | Qty: 90 | Fill #0

## 2017-12-11 NOTE — Telephone Encounter (Signed)
Patient is calling regarding his new BP medication from his OV last week.  States that normally the pharmacy has contacted him by now.  He states that it was to be a combination drug of Benicar and something.    Pharmacy:  [facility]  Thank you.

## 2017-12-11 NOTE — Telephone Encounter (Signed)
Rx called in 

## 2017-12-11 NOTE — Telephone Encounter (Signed)
Combine HCTZ and Benicar into one tab. See orders

## 2017-12-31 NOTE — Patient Instructions (Addendum)
Encourage diet exercise and weight loss.  Benicar and HCTZ can be combined into one medication 40/25  when patient is ready for refill.   Follow-up in June for physical exam.

## 2018-01-15 ENCOUNTER — Other Ambulatory Visit: Payer: Self-pay | Admitting: Internal Medicine

## 2018-01-15 MED FILL — ROSUVASTATIN CALCIUM 5 MG T: 5 | 90 days supply | Qty: 90 | Fill #1

## 2018-01-15 MED FILL — metFORMIN HCL 500 MG TABS: 500 | 90 days supply | Qty: 90 | Fill #1

## 2018-01-15 MED FILL — SYNTHROID 112 MCG TABLET: 112 | 90 days supply | Qty: 90 | Fill #0

## 2018-01-15 MED FILL — AMLODIPINE BESYLATE 5 MG TA: 5 | 90 days supply | Qty: 90 | Fill #1

## 2018-02-28 ENCOUNTER — Other Ambulatory Visit: Payer: Self-pay

## 2018-02-28 DIAGNOSIS — E119 Type 2 diabetes mellitus without complications: Secondary | ICD-10-CM

## 2018-02-28 DIAGNOSIS — Z0000 Encounter for general adult medical examination without abnormal findings: Secondary | ICD-10-CM

## 2018-02-28 DIAGNOSIS — E785 Hyperlipidemia, unspecified: Secondary | ICD-10-CM

## 2018-02-28 DIAGNOSIS — E039 Hypothyroidism, unspecified: Secondary | ICD-10-CM

## 2018-02-28 DIAGNOSIS — I10 Essential (primary) hypertension: Secondary | ICD-10-CM

## 2018-03-15 ENCOUNTER — Other Ambulatory Visit: Payer: 59 | Admitting: Internal Medicine

## 2018-03-15 DIAGNOSIS — Z125 Encounter for screening for malignant neoplasm of prostate: Secondary | ICD-10-CM

## 2018-03-15 DIAGNOSIS — E119 Type 2 diabetes mellitus without complications: Secondary | ICD-10-CM

## 2018-03-15 DIAGNOSIS — E039 Hypothyroidism, unspecified: Secondary | ICD-10-CM

## 2018-03-15 DIAGNOSIS — E785 Hyperlipidemia, unspecified: Secondary | ICD-10-CM | POA: Diagnosis not present

## 2018-03-15 DIAGNOSIS — I10 Essential (primary) hypertension: Secondary | ICD-10-CM | POA: Diagnosis not present

## 2018-03-15 DIAGNOSIS — Z0000 Encounter for general adult medical examination without abnormal findings: Secondary | ICD-10-CM

## 2018-03-16 LAB — COMPLETE METABOLIC PANEL WITH GFR
AG RATIO: 2.2 (calc) (ref 1.0–2.5)
ALT: 39 U/L (ref 9–46)
AST: 27 U/L (ref 10–35)
Albumin: 5 g/dL (ref 3.6–5.1)
Alkaline phosphatase (APISO): 40 U/L (ref 40–115)
BUN: 21 mg/dL (ref 7–25)
CHLORIDE: 103 mmol/L (ref 98–110)
CO2: 29 mmol/L (ref 20–32)
Calcium: 10 mg/dL (ref 8.6–10.3)
Creat: 1.23 mg/dL (ref 0.70–1.33)
GFR, Est African American: 79 mL/min/{1.73_m2} (ref >=60)
GFR, Est Non African American: 68 mL/min/{1.73_m2} (ref >=60)
GLOBULIN: 2.3 g/dL (ref 1.9–3.7)
Glucose, Bld: 99 mg/dL (ref 65–99)
POTASSIUM: 4.1 mmol/L (ref 3.5–5.3)
SODIUM: 139 mmol/L (ref 135–146)
Total Bilirubin: 0.6 mg/dL (ref 0.2–1.2)
Total Protein: 7.3 g/dL (ref 6.1–8.1)

## 2018-03-16 LAB — MICROALBUMIN / CREATININE URINE RATIO
CREATININE, URINE: 90 mg/dL (ref 20–320)
MICROALB UR: 0.3 mg/dL
Microalb Creat Ratio: 3 mcg/mg creat (ref <30)

## 2018-03-16 LAB — CBC WITH DIFFERENTIAL/PLATELET
BASOS PCT: 0.5 %
Basophils Absolute: 21 cells/uL (ref 0–200)
EOS ABS: 38 {cells}/uL (ref 15–500)
EOS PCT: 0.9 %
HCT: 42.6 % (ref 38.5–50.0)
Hemoglobin: 14.8 g/dL (ref 13.2–17.1)
Lymphs Abs: 1613 cells/uL (ref 850–3900)
MCH: 27.4 pg (ref 27.0–33.0)
MCHC: 34.7 g/dL (ref 32.0–36.0)
MCV: 78.9 fL — ABNORMAL LOW (ref 80.0–100.0)
MONOS PCT: 8.3 %
MPV: 10.4 fL (ref 7.5–12.5)
NEUTROS ABS: 2180 {cells}/uL (ref 1500–7800)
Neutrophils Relative %: 51.9 %
PLATELETS: 293 10*3/uL (ref 140–400)
RBC: 5.4 10*6/uL (ref 4.20–5.80)
RDW: 12.2 % (ref 11.0–15.0)
TOTAL LYMPHOCYTE: 38.4 %
WBC mixed population: 349 cells/uL (ref 200–950)
WBC: 4.2 10*3/uL (ref 3.8–10.8)

## 2018-03-16 LAB — HEMOGLOBIN A1C
HEMOGLOBIN A1C: 6.2 %{Hb} — AB (ref <5.7)
MEAN PLASMA GLUCOSE: 131 (calc)
eAG (mmol/L): 7.3 (calc)

## 2018-03-16 LAB — LIPID PANEL
CHOL/HDL RATIO: 4.8 (calc) (ref <5.0)
CHOLESTEROL: 152 mg/dL (ref <200)
HDL: 32 mg/dL — AB (ref >40)
LDL CHOLESTEROL (CALC): 95 mg/dL
NON-HDL CHOLESTEROL (CALC): 120 mg/dL (ref <130)
Triglycerides: 153 mg/dL — ABNORMAL HIGH (ref <150)

## 2018-03-16 LAB — TSH: TSH: 2.05 mIU/L (ref 0.40–4.50)

## 2018-03-16 LAB — PSA: PSA: 0.6 ng/mL (ref <=4.0)

## 2018-03-19 MED FILL — OLMESARTAN-HCTZ 40-25 MG TA: 40-25 | 90 days supply | Qty: 90 | Fill #1

## 2018-03-20 ENCOUNTER — Ambulatory Visit (INDEPENDENT_AMBULATORY_CARE_PROVIDER_SITE_OTHER): Payer: 59 | Admitting: Internal Medicine

## 2018-03-20 ENCOUNTER — Encounter: Payer: Self-pay | Admitting: Internal Medicine

## 2018-03-20 VITALS — BP 90/60 | HR 65 | Temp 98.1°F | Ht 70.0 in | Wt 217.0 lb

## 2018-03-20 DIAGNOSIS — Z1211 Encounter for screening for malignant neoplasm of colon: Secondary | ICD-10-CM

## 2018-03-20 DIAGNOSIS — K219 Gastro-esophageal reflux disease without esophagitis: Secondary | ICD-10-CM | POA: Diagnosis not present

## 2018-03-20 DIAGNOSIS — R7302 Impaired glucose tolerance (oral): Secondary | ICD-10-CM

## 2018-03-20 DIAGNOSIS — E039 Hypothyroidism, unspecified: Secondary | ICD-10-CM

## 2018-03-20 DIAGNOSIS — E781 Pure hyperglyceridemia: Secondary | ICD-10-CM

## 2018-03-20 DIAGNOSIS — Z800 Family history of malignant neoplasm of digestive organs: Secondary | ICD-10-CM

## 2018-03-20 DIAGNOSIS — Z0000 Encounter for general adult medical examination without abnormal findings: Secondary | ICD-10-CM

## 2018-03-20 DIAGNOSIS — E8881 Metabolic syndrome: Secondary | ICD-10-CM | POA: Diagnosis not present

## 2018-03-20 DIAGNOSIS — Z6831 Body mass index (BMI) 31.0-31.9, adult: Secondary | ICD-10-CM | POA: Diagnosis not present

## 2018-03-20 DIAGNOSIS — E786 Lipoprotein deficiency: Secondary | ICD-10-CM | POA: Diagnosis not present

## 2018-03-20 DIAGNOSIS — I10 Essential (primary) hypertension: Secondary | ICD-10-CM | POA: Diagnosis not present

## 2018-03-20 DIAGNOSIS — B009 Herpesviral infection, unspecified: Secondary | ICD-10-CM | POA: Diagnosis not present

## 2018-03-20 LAB — POCT URINALYSIS DIPSTICK
APPEARANCE: NORMAL
Bilirubin, UA: NEGATIVE
Blood, UA: NEGATIVE
Glucose, UA: NEGATIVE
Ketones, UA: NEGATIVE
LEUKOCYTES UA: NEGATIVE
Nitrite, UA: NEGATIVE
ODOR: NORMAL
PH UA: 6.5 (ref 5.0–8.0)
Protein, UA: NEGATIVE
Spec Grav, UA: 1.015 (ref 1.010–1.025)
UROBILINOGEN UA: 0.2 U/dL

## 2018-03-20 MED ORDER — METFORMIN HCL 500 MG PO TABS: 500.0000 mg | ORAL_TABLET | Freq: Two times a day (BID) | ORAL | 3 refills | Status: DC

## 2018-03-20 NOTE — Progress Notes (Signed)
Subjective:    Patient ID: Benjamin Lopez, male    DOB: 1966/08/29, 52 y.o.   MRN: [id number]  HPI 52 year old Male for health maintenance exam and evaluation of medical issues.  Has lost 11 pounds since last year.  BMI is 31.14.  Discussion regarding diet exercise and weight loss.  History of essential hypertension,  impaired glucose tolerance, hypertriglyceridemia hypothyroidism, metabolic syndrome.  Referral made for screening colonoscopy.  Triglycerides elevated at 153 and 7 months ago were 209.  This is a good improvement.  LDL cholesterol has improved from 109 to 95.  Hemoglobin A1c 6.2%.  TSH within normal limits at 2.05.  Social history: He is married.  2 daughters.  Does not smoke.  Social alcohol consumption.  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  Family history: Mother with history of coronary artery disease, impaired glucose tolerance, colon cancer, diabetes mellitus, hypertension, hyperlipidemia as well as COPD and osteoarthritis.  Father's family history is unknown and father's history is unknown.  He is an only child.  Maternal aunt recently died from complications of COPD but had diabetes mellitus, history of breast cancer and hyperlipidemia.  No known drug allergies  Past medical history: Fractured right clavicle 1981, fractured right tibia 1983, mononucleosis at age 74.  Poststreptococcal glomerulonephritis 1986.  Fractured right elbow 1998.  Bilateral hydrocele repair 1971.  History of duplication of the left collecting system.      Review of Systems  Constitutional: Negative.   All other systems reviewed and are negative.      Objective:   Physical Exam  Constitutional: He is oriented to person, place, and time. He appears well-developed and well-nourished. No distress.  HENT:  Head: Normocephalic and atraumatic.  Right Ear: External ear normal.  Left Ear: External ear normal.  Nose: Nose normal.  Mouth/Throat: Oropharynx is  clear and moist.  Eyes: Pupils are equal, round, and reactive to light. Conjunctivae and EOM are normal. Right eye exhibits no discharge. Left eye exhibits no discharge. No scleral icterus.  Neck: No JVD present. No thyromegaly present.  Cardiovascular: Normal rate, regular rhythm and normal heart sounds. Exam reveals no friction rub.  No murmur heard. Pulmonary/Chest: Effort normal and breath sounds normal. No stridor. No respiratory distress. He has no wheezes. He has no rales.  Abdominal: Soft. Bowel sounds are normal. He exhibits no distension and no mass. There is no tenderness. There is no rebound and no guarding. No hernia.  Genitourinary: Prostate normal.  Musculoskeletal: He exhibits no edema.  Lymphadenopathy:    He has no cervical adenopathy.  Neurological: He is alert and oriented to person, place, and time. He displays normal reflexes. No cranial nerve deficit or sensory deficit. He exhibits normal muscle tone. Coordination normal.  Skin: Skin is warm and dry.  Psychiatric: He has a normal mood and affect. His behavior is normal. Thought content normal.          Assessment & Plan:  Essential hypertension-stable on current regimen  Hypertriglyceridemia-stable and improved.  Currently on Crestor 5 mg daily and TriCor.  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  Have screening colonoscopy.  Work on [employer]  exercise and further weight loss.

## 2018-03-31 NOTE — Patient Instructions (Addendum)
Please work on diet exercise and weight loss.  Referral made for colonoscopy.  Return in 6 months.  Continue same medications.

## 2018-04-04 ENCOUNTER — Encounter: Payer: Self-pay | Admitting: Internal Medicine

## 2018-04-16 ENCOUNTER — Other Ambulatory Visit: Payer: Self-pay | Admitting: Internal Medicine

## 2018-04-16 MED FILL — ROSUVASTATIN CALCIUM 5 MG T: 5 | 90 days supply | Qty: 90 | Fill #2

## 2018-04-16 MED FILL — SYNTHROID 112 MCG TABLET: 112 | 90 days supply | Qty: 90 | Fill #0

## 2018-05-03 ENCOUNTER — Ambulatory Visit (INDEPENDENT_AMBULATORY_CARE_PROVIDER_SITE_OTHER): Payer: 59 | Admitting: Internal Medicine

## 2018-05-03 ENCOUNTER — Encounter: Payer: Self-pay | Admitting: Internal Medicine

## 2018-05-03 VITALS — BP 120/80 | HR 82 | Ht 70.0 in | Wt 222.0 lb

## 2018-05-03 DIAGNOSIS — R7302 Impaired glucose tolerance (oral): Secondary | ICD-10-CM | POA: Diagnosis not present

## 2018-05-03 DIAGNOSIS — E781 Pure hyperglyceridemia: Secondary | ICD-10-CM

## 2018-05-03 DIAGNOSIS — Z6831 Body mass index (BMI) 31.0-31.9, adult: Secondary | ICD-10-CM | POA: Diagnosis not present

## 2018-05-03 DIAGNOSIS — E786 Lipoprotein deficiency: Secondary | ICD-10-CM

## 2018-05-03 DIAGNOSIS — E8881 Metabolic syndrome: Secondary | ICD-10-CM | POA: Diagnosis not present

## 2018-05-03 DIAGNOSIS — I10 Essential (primary) hypertension: Secondary | ICD-10-CM | POA: Diagnosis not present

## 2018-05-03 NOTE — Progress Notes (Signed)
   Subjective:    Patient ID: Benjamin Lopez, male    DOB: Dec 02, 1965, 52 y.o.   MRN: [id number]  HPI He is here today for follow-up for weight loss.  Weight is 222 pounds and on June 18 was 217 pounds.  He needs to consider Dr. Migdalia Dk weight loss clinic.  He is not really motivated to diet and exercise.  I think he should go see Dr. Oval Linsey for Cardiology screening with a strong family history of coronary artery disease.    Review of Systems     Objective:   Physical Exam Neck supple.  No carotid bruits.  Chest clear to auscultation.  Cardiac exam regular rate and rhythm.  Extremities without edema.       Assessment & Plan:  I am concerned that he is not motivated to diet and exercise.  This is a risk.  He has hypertension, hyperlipidemia and impaired glucose tolerance all of which were done in his family.  Plan: We will arrange for cardiology consultation.  Formation given on Dr. Migdalia Dk clinic.  Has upcoming colonoscopy with Dr. Carlean Purl.  39-month recheck appointment is due in December.  However I see he is made it for February.

## 2018-05-03 NOTE — Patient Instructions (Signed)
Please continue diet exercise and weight loss.  We will be sending you to cardiology for cardiac screening with strong history and multiple risk factors for coronary disease.  To have colonoscopy next week.  Follow-up here in 6 months.  Consider Dr. Migdalia Dk clinic.

## 2018-05-18 ENCOUNTER — Other Ambulatory Visit: Payer: Self-pay | Admitting: Internal Medicine

## 2018-05-18 MED FILL — metFORMIN HCL 500 MG TABS: 500 | 90 days supply | Qty: 180 | Fill #0

## 2018-05-18 MED FILL — AMLODIPINE BESYLATE 5 MG TA: 5 | 90 days supply | Qty: 90 | Fill #0

## 2018-05-30 ENCOUNTER — Ambulatory Visit (AMBULATORY_SURGERY_CENTER): Payer: Self-pay | Admitting: *Deleted

## 2018-05-30 ENCOUNTER — Encounter: Payer: Self-pay | Admitting: Internal Medicine

## 2018-05-30 VITALS — Ht 70.5 in | Wt 226.0 lb

## 2018-05-30 DIAGNOSIS — Z800 Family history of malignant neoplasm of digestive organs: Secondary | ICD-10-CM

## 2018-05-30 MED ORDER — NA SULFATE-K SULFATE-MG SULF 17.5-3.13-1.6 GM/177ML PO SOLN: 1.0000 | Freq: Once | ORAL | 0 refills | Status: AC

## 2018-05-30 MED FILL — SUPREP BOWEL PREP KIT: 17.5-3.13-1 | 1 days supply | Qty: 354 | Fill #0

## 2018-05-30 NOTE — Progress Notes (Signed)
No egg or soy allergy known to patient  No issues with past sedation with any surgeries  or procedures, no intubation problems  No diet pills per patient No home 02 use per patient  No blood thinners per patient  Pt denies issues with constipation  No A fib or A flutter  EMMI video sent to pt's e mail pt declined   

## 2018-06-12 ENCOUNTER — Encounter: Payer: Self-pay | Admitting: Cardiovascular Disease

## 2018-06-12 ENCOUNTER — Ambulatory Visit: Payer: 59 | Admitting: Cardiovascular Disease

## 2018-06-12 VITALS — BP 118/76 | HR 63 | Ht 70.5 in | Wt 223.0 lb

## 2018-06-12 DIAGNOSIS — E7800 Pure hypercholesterolemia, unspecified: Secondary | ICD-10-CM | POA: Diagnosis not present

## 2018-06-12 DIAGNOSIS — R0789 Other chest pain: Secondary | ICD-10-CM

## 2018-06-12 DIAGNOSIS — Z8249 Family history of ischemic heart disease and other diseases of the circulatory system: Secondary | ICD-10-CM

## 2018-06-12 DIAGNOSIS — I10 Essential (primary) hypertension: Secondary | ICD-10-CM

## 2018-06-12 DIAGNOSIS — Z01812 Encounter for preprocedural laboratory examination: Secondary | ICD-10-CM | POA: Diagnosis not present

## 2018-06-12 MED ORDER — METOPROLOL TARTRATE 25 MG PO TABS: ORAL_TABLET | ORAL | 0 refills | Status: DC

## 2018-06-12 MED FILL — METOPROLOL TARTRATE 25 MG T: 25 | 1 days supply | Qty: 1 | Fill #0

## 2018-06-12 NOTE — Patient Instructions (Addendum)
Medication Instructions:  TAKE METOPROLOL TART 25 MG 1 HOUR PRIOR TO CT   Labwork: BMET 1 WEEK PRIOR TO CT  Testing/Procedures: Your physician has requested that you have cardiac CT. Cardiac computed tomography (CT) is a painless test that uses an x-ray machine to take clear, detailed pictures of your heart. For further information please visit [email or web address]. Please follow instruction sheet as given. THE OFFICE WILL CALL YOU TO SCHEDULE ONCE THIS HAS BEEN APPROVED BY YOUR INSURANCE. IF YOU DO NOT HEAR IN 2 WEEKS CALL [phone]  Follow-Up: Your physician wants you to follow-up in: 1 YEAR You will receive a reminder letter in the mail two months in advance. If you don't receive a letter, please call our office to schedule the follow-up appointment.  Any Other Special Instructions Will Be Listed Below (If Applicable).    Please arrive at the [facility] main entrance of [facility] at xx:xx AM (30-45 minutes prior to test start time)  [facility] [place], [place] [address] [phone]  Proceed to the [facility] Radiology Department (First Floor).  Please follow these instructions carefully (unless otherwise directed):  Hold all erectile dysfunction medications at least 48 hours prior to test.  On the Night Before the Test: . Drink plenty of water. . Do not consume any caffeinated/decaffeinated beverages or chocolate 12 hours prior to your test. . Do not take any antihistamines 12 hours prior to your test. . If you take Metformin do not take 24 hours prior to test.  On the Day of the Test: . Drink plenty of water. Do not drink any water within one hour of the test. . Do not eat any food 4 hours prior to the test. . You may take your regular medications prior to the test. . IF NOT ON A BETA BLOCKER - Take 25 mg of lopressor (metoprolol) one hour before the test. . HOLD Furosemide morning of the test.  After the Test: . Drink plenty  of water. . After receiving IV contrast, you may experience a mild flushed feeling. This is normal. . On occasion, you may experience a mild rash up to 24 hours after the test. This is not dangerous. If this occurs, you can take Benadryl 25 mg and increase your fluid intake. . If you experience trouble breathing, this can be serious. If it is severe call 911 IMMEDIATELY. If it is mild, please call our office. . If you take any of these medications: Glipizide/Metformin, Avandament, Glucavance, please do not take 48 hours after completing test.  Cardiac CT Angiogram A cardiac CT angiogram is a procedure to look at the heart and the area around the heart. It may be done to help find the cause of chest pains or other symptoms of heart disease. During this procedure, a large X-ray machine, called a CT scanner, takes detailed pictures of the heart and the surrounding area after a dye (contrast material) has been injected into blood vessels in the area. The procedure is also sometimes called a coronary CT angiogram, coronary artery scanning, or CTA. A cardiac CT angiogram allows the health care provider to see how well blood is flowing to and from the heart. The health care provider will be able to see if there are any problems, such as:  Blockage or narrowing of the coronary arteries in the heart.  Fluid around the heart.  Signs of weakness or disease in the muscles, valves, and tissues of the heart.  Tell a  health care provider about:  Any allergies you have. This is especially important if you have had a previous allergic reaction to contrast dye.  All medicines you are taking, including vitamins, herbs, eye drops, creams, and over-the-counter medicines.  Any blood disorders you have.  Any surgeries you have had.  Any medical conditions you have.  Whether you are pregnant or may be pregnant.  Any anxiety disorders, chronic pain, or other conditions you have that may increase your stress or  prevent you from lying still. What are the risks? Generally, this is a safe procedure. However, problems may occur, including:  Bleeding.  Infection.  Allergic reactions to medicines or dyes.  Damage to other structures or organs.  Kidney damage from the dye or contrast that is used.  Increased risk of cancer from radiation exposure. This risk is low. Talk with your health care provider about: ? The risks and benefits of testing. ? How you can receive the lowest dose of radiation.  What happens before the procedure?  Wear comfortable clothing and remove any jewelry, glasses, dentures, and hearing aids.  Follow instructions from your health care provider about eating and drinking. This may include: ? For 12 hours before the test - avoid caffeine. This includes tea, coffee, soda, energy drinks, and diet pills. Drink plenty of water or other fluids that do not have caffeine in them. Being well-hydrated can prevent complications. ? For 4-6 hours before the test - stop eating and drinking. The contrast dye can cause nausea, but this is less likely if your stomach is empty.  Ask your health care provider about changing or stopping your regular medicines. This is especially important if you are taking diabetes medicines, blood thinners, or medicines to treat erectile dysfunction. What happens during the procedure?  Hair on your chest may need to be removed so that small sticky patches called electrodes can be placed on your chest. These will transmit information that helps to monitor your heart during the test.  An IV tube will be inserted into one of your veins.  You might be given a medicine to control your heart rate during the test. This will help to ensure that good images are obtained.  You will be asked to lie on an exam table. This table will slide in and out of the CT machine during the procedure.  Contrast dye will be injected into the IV tube. You might feel warm, or you may  get a metallic taste in your mouth.  You will be given a medicine (nitroglycerin) to relax (dilate) the arteries in your heart.  The table that you are lying on will move into the CT machine tunnel for the scan.  The person running the machine will give you instructions while the scans are being done. You may be asked to: ? Keep your arms above your head. ? Hold your breath. ? Stay very still, even if the table is moving.  When the scanning is complete, you will be moved out of the machine.  The IV tube will be removed. The procedure may vary among health care providers and hospitals. What happens after the procedure?  You might feel warm, or you may get a metallic taste in your mouth from the contrast dye.  You may have a headache from the nitroglycerin.  After the procedure, drink water or other fluids to wash (flush) the contrast material out of your body.  Contact a health care provider if you have any symptoms of  allergy to the contrast. These symptoms include: ? Shortness of breath. ? Rash or hives. ? A racing heartbeat.  Most people can return to their normal activities right after the procedure. Ask your health care provider what activities are safe for you.  It is up to you to get the results of your procedure. Ask your health care provider, or the department that is doing the procedure, when your results will be ready. Summary  A cardiac CT angiogram is a procedure to look at the heart and the area around the heart. It may be done to help find the cause of chest pains or other symptoms of heart disease.  During this procedure, a large X-ray machine, called a CT scanner, takes detailed pictures of the heart and the surrounding area after a dye (contrast material) has been injected into blood vessels in the area.  Ask your health care provider about changing or stopping your regular medicines before the procedure. This is especially important if you are taking diabetes  medicines, blood thinners, or medicines to treat erectile dysfunction.  After the procedure, drink water or other fluids to wash (flush) the contrast material out of your body. This information is not intended to replace advice given to you by your health care provider. Make sure you discuss any questions you have with your health care provider. Document Released: 09/01/2008 Document Revised: 08/08/2016 Document Reviewed: 08/08/2016 Elsevier Interactive Patient Education  2017 [employer].

## 2018-06-12 NOTE — Progress Notes (Signed)
Cardiology Office Note   Date:  06/12/2018   ID:  Benjamin Lopez, DOB [birth year], MRN [id number]  PCP:  Elby Showers, MD  Cardiologist:   Skeet Latch, MD   No chief complaint on file.     History of Present Illness: Benjamin NIDIFFER is a 52 y.o. male with hypertension and hyperlipidemia who presents for follow-up.  He was initially seen 07/2016 for management of hypertension.  He had been working with his PCP and his blood pressure was well-controlled on losartan.  Mr. Meneely previously saw Dr. Haroldine Laws for palpitations.  He was seen in the ED after noting palpitations while at work.  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  He noted some palpitations and checked his heart rate, noting it was irregular.  In the ED he reportedly had a heart rate in the 120s. He was treated with IV fluids and his symptoms resolved.  He has not experienced any recurrent palpitations, lightheadedness or dizziness.  Overall he has been doing well.  However he has had some episodes of left-sided back pain that radiated into his chest.  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  He also likes to play disc golf.  He wonders if it may be musculoskeletal.  However, given his cardiac history he wanted further evaluation.  He has not experienced any shortness of breath, nausea, or diaphoresis with these episodes.  They typically occur at rest but sometimes when he is playing golf.  He has no lower extremity edema, orthopnea, or PND.  Since he was last seen he was switched from atorvastatin to rosuvastatin due to myalgias.  He is also been under a lot of stress.  His 2 daughters were in a car accident along with their mother and grandmother.  The grandmother was killed in the accident.  His youngest daughter had her driver's permit and is now afraid to drive again.  He has a history of premature CAD.  Both his mother and maternal grandfather had MIs in their 36s.  He has been working on weight loss  by limiting sugary drinks and increasing exercise.    Past Medical History:  Diagnosis Date  . Allergy    mild- prn Zyrtec  . Arthritis    knees and hips  . Diabetes mellitus without complication ([place])    on metformin- diet controlled  . Fracture of fibula, right, closed 1998  . Fracture of right clavicle 1981  . Fracture of tibia, right, closed 1983  . GERD (gastroesophageal reflux disease)    occ and uses Nexium PRN only   . 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  . History of mononucleosis    at age 66  . Hyperlipidemia   . Hypertension   . Thyroid disease     Past Surgical History:  Procedure Laterality Date  . HYDROCELE EXCISION / REPAIR  1971   bilateral  . WISDOM TOOTH EXTRACTION       Current Outpatient Medications  Medication Sig Dispense Refill  . amLODipine (NORVASC) 5 MG tablet TAKE 1 TABLET BY MOUTH DAILY. 90 tablet 1  . cyclobenzaprine (FLEXERIL) 10 MG tablet Take 1 tablet (10 mg total) by mouth at bedtime. (Patient taking differently: Take 10 mg by mouth 3 (three) times daily as needed. ) 30 tablet 0  . esomeprazole (NEXIUM) 40 MG capsule Take 1 capsule (40 mg total) by mouth daily. (Patient taking differently: Take 40 mg by  mouth daily as needed. ) 30 capsule 3  . fenofibrate (TRICOR) 145 MG tablet Take 1 tablet (145 mg total) by mouth daily. 90 tablet 1  . metFORMIN (GLUCOPHAGE) 500 MG tablet Take 1 tablet (500 mg total) by mouth 2 (two) times daily with a meal. 180 tablet 3  . olmesartan-hydrochlorothiazide (BENICAR HCT) 40-25 MG tablet Take 1 tablet by mouth daily. 90 tablet 1  . rosuvastatin (CRESTOR) 5 MG tablet TAKE 1 TABLET BY MOUTH ONCE DAILY 90 tablet PRN  . SYNTHROID 112 MCG tablet TAKE 1 TABLET BY MOUTH ONCE DAILY 90 tablet 0  . valACYclovir (VALTREX) 500 MG tablet TAKE 1 TABLET BY MOUTH TWO TIMES DAILY. (Patient taking differently: daily as needed. ) 10 tablet prn  . metoprolol tartrate (LOPRESSOR) 25 MG tablet TAKE 1 TABLET BY MOUTH 1  HOUR PRIOR TO CARDIAC CT 1 tablet 0   No current facility-administered medications for this visit.     Allergies:   Patient has no known allergies.    Social History:  The patient  reports that he has never smoked. He has never used smokeless tobacco. He reports that he does not drink alcohol or use drugs.   Family History:  The patient's family history includes CAD in his paternal grandfather; Colon cancer in his mother; Colon polyps in his maternal uncle; Depression in his maternal aunt and maternal grandfather; Diabetes in his maternal aunt, maternal grandmother, mother, and paternal grandmother; Heart attack in his mother; Heart disease in his maternal grandfather; Hyperlipidemia in his maternal aunt, maternal grandfather, and mother; Hypertension in his mother.    ROS:  Please see the history of present illness.   Otherwise, review of systems are positive for none.   All other systems are reviewed and negative.    PHYSICAL EXAM: VS:  BP 118/76   Pulse 63   Ht 5' 10.5" (1.791 m)   Wt 223 lb (101.2 kg)   BMI 31.54 kg/m  , BMI Body mass index is 31.54 kg/m. GENERAL:  Well appearing HEENT: Pupils equal round and reactive, fundi not visualized, oral mucosa unremarkable NECK:  No jugular venous distention, waveform within normal limits, carotid upstroke brisk and symmetric, no bruits LUNGS:  Clear to auscultation bilaterally HEART:  RRR.  PMI not displaced or sustained,S1 and S2 within normal limits, no S3, no S4, no clicks, no rubs, no murmurs ABD:  Flat, positive bowel sounds normal in frequency in pitch, no bruits, no rebound, no guarding, no midline pulsatile mass, no hepatomegaly, no splenomegaly EXT:  2 plus pulses throughout, no edema, no cyanosis no clubbing SKIN:  No rashes no nodules NEURO:  Cranial nerves II through XII grossly intact, motor grossly intact throughout PSYCH:  Cognitively intact, oriented to person place and time   EKG:  EKG is ordered today. The ekg  ordered today demonstrates sinus rhythm rate 77 bpm.   06/12/18: Sinus rhythm.  Rate 63 bpm.    Recent Labs: 03/15/2018: ALT 39; BUN 21; Creat 1.23; Hemoglobin 14.8; Platelets 293; Potassium 4.1; Sodium 139; TSH 2.05    Lipid Panel    Component Value Date/Time   CHOL 152 03/15/2018 0927   TRIG 153 (H) 03/15/2018 0927   HDL 32 (L) 03/15/2018 0927   CHOLHDL 4.8 03/15/2018 0927   VLDL 37 (H) 03/14/2017 0947   LDLCALC 95 03/15/2018 0927      Wt Readings from Last 3 Encounters:  06/12/18 223 lb (101.2 kg)  05/30/18 226 lb (102.5 kg)  05/03/18 222 lb (  100.7 kg)      ASSESSMENT AND PLAN:  # Atypical chest pain: # Hyperlipidemia:   Symptoms are atypical and likely musculoskeletal.  However, he does have several risk factors.  We will get a cardiac CT-a to ensure that he does not have ischemia.  Continue rosuvastatin.  If he has CAD we will need to lower his LDL goal to 70 and start aspirin.  # Hypertension: Blood pressure well-controlled on olmesartan/HCTZ and amlodipine.  Keep working on diet and exercise.   # Obesity: We discussed the importance of weight loss, especially given his family history of CAD.  Mr. Kowalke will work on increasing his exercise to at least 30 minutes most days of the week.  He will also work on improving his diet.    Current medicines are reviewed at length with the patient today.  The patient does not have concerns regarding medicines.  The following changes have been made:  no change  Labs/ tests ordered today include:   Orders Placed This Encounter  Procedures  . CT CORONARY MORPH W/CTA COR W/SCORE W/CA W/CM &/OR WO/CM  . CT CORONARY FRACTIONAL FLOW RESERVE DATA PREP  . CT CORONARY FRACTIONAL FLOW RESERVE FLUID ANALYSIS  . Basic metabolic panel  . EKG 12-Lead     Disposition:   FU with Yanira Tolsma C. Oval Linsey, MD, [facility] in 1 year.     Signed, Nikan Ellingson C. Oval Linsey, MD, [facility]  06/12/2018 12:02 PM    [facility]

## 2018-06-13 ENCOUNTER — Encounter: Payer: Self-pay | Admitting: Internal Medicine

## 2018-06-13 ENCOUNTER — Ambulatory Visit (AMBULATORY_SURGERY_CENTER): Payer: 59 | Admitting: Internal Medicine

## 2018-06-13 VITALS — BP 105/64 | HR 73 | Temp 98.6°F | Resp 12 | Ht 70.5 in | Wt 226.0 lb

## 2018-06-13 DIAGNOSIS — Z860100 Personal history of colon polyps, unspecified: Secondary | ICD-10-CM

## 2018-06-13 DIAGNOSIS — K219 Gastro-esophageal reflux disease without esophagitis: Secondary | ICD-10-CM | POA: Diagnosis not present

## 2018-06-13 DIAGNOSIS — D123 Benign neoplasm of transverse colon: Secondary | ICD-10-CM

## 2018-06-13 DIAGNOSIS — E119 Type 2 diabetes mellitus without complications: Secondary | ICD-10-CM | POA: Diagnosis not present

## 2018-06-13 DIAGNOSIS — D120 Benign neoplasm of cecum: Secondary | ICD-10-CM | POA: Diagnosis not present

## 2018-06-13 DIAGNOSIS — Z1211 Encounter for screening for malignant neoplasm of colon: Secondary | ICD-10-CM

## 2018-06-13 DIAGNOSIS — Z800 Family history of malignant neoplasm of digestive organs: Secondary | ICD-10-CM

## 2018-06-13 DIAGNOSIS — D122 Benign neoplasm of ascending colon: Secondary | ICD-10-CM | POA: Diagnosis not present

## 2018-06-13 DIAGNOSIS — I10 Essential (primary) hypertension: Secondary | ICD-10-CM | POA: Diagnosis not present

## 2018-06-13 HISTORY — DX: Personal history of colonic polyps: Z86.010

## 2018-06-13 HISTORY — DX: Personal history of colon polyps, unspecified: Z86.0100

## 2018-06-13 MED ORDER — SODIUM CHLORIDE 0.9 % IV SOLN: 500.0000 mL | Freq: Once | INTRAVENOUS | Status: DC

## 2018-06-13 NOTE — Op Note (Signed)
[name] Procedure Date: 06/13/2018 10:04 AM MRN: [id number] Endoscopist: Gatha Mayer , MD Age: 52 Referring MD:  Date of Birth: [birth year] Gender: Male Account #: [id number] Procedure:                Colonoscopy Indications:              Screening for colorectal malignant neoplasm, This                            is the patient's first colonoscopy Medicines:                Propofol per Anesthesia, Monitored Anesthesia Care Procedure:                Pre-Anesthesia Assessment:                           - Prior to the procedure, a History and Physical                            was performed, and patient medications and                            allergies were reviewed. The patient's tolerance of                            previous anesthesia was also reviewed. The risks                            and benefits of the procedure and the sedation                            options and risks were discussed with the patient.                            All questions were answered, and informed consent                            was obtained. Prior Anticoagulants: The patient has                            taken no previous anticoagulant or antiplatelet                            agents. ASA Grade Assessment: II - A patient with                            mild systemic disease. After reviewing the risks                            and benefits, the patient was deemed in                            satisfactory condition to undergo the procedure.  After obtaining informed consent, the colonoscope                            was passed under direct vision. Throughout the                            procedure, the patient's blood pressure, pulse, and                            oxygen saturations were monitored continuously. The                            Colonoscope was introduced through the anus and   advanced to the the cecum, identified by                            appendiceal orifice and ileocecal valve. The                            colonoscopy was performed without difficulty. The                            patient tolerated the procedure well. The quality                            of the bowel preparation was excellent. The                            ileocecal valve, appendiceal orifice, and rectum                            were photographed. Scope In: 10:10:39 AM Scope Out: 10:26:04 AM Scope Withdrawal Time: 0 hours 12 minutes 39 seconds  Total Procedure Duration: 0 hours 15 minutes 25 seconds  Findings:                 The perianal and digital rectal examinations were                            normal. Pertinent negatives include normal prostate                            (size, shape, and consistency).                           A 4 mm polyp was found in the transverse colon. The                            polyp was sessile. The polyp was removed with a                            cold snare. Resection and retrieval were complete.                            Verification of patient identification  for the                            specimen was done. Estimated blood loss was minimal.                           Two flat and sessile polyps were found in the                            ascending colon and cecum. The polyps were 1 to 2                            mm in size. These polyps were removed with a cold                            biopsy forceps. Resection and retrieval were                            complete. Verification of patient identification                            for the specimen was done. Estimated blood loss was                            minimal.                           The exam was otherwise without abnormality on                            direct and retroflexion views. Complications:            No immediate complications. Estimated Blood Loss:      Estimated blood loss was minimal. Impression:               - One 4 mm polyp in the transverse colon, removed                            with a cold snare. Resected and retrieved.                           - Two 1 to 2 mm polyps in the ascending colon and                            in the cecum, removed with a cold biopsy forceps.                            Resected and retrieved.                           - The examination was otherwise normal on direct                            and retroflexion views. Recommendation:           -  Patient has a contact number available for                            emergencies. The signs and symptoms of potential                            delayed complications were discussed with the                            patient. Return to normal activities tomorrow.                            Written discharge instructions were provided to the                            patient.                           - Resume previous diet.                           - Continue present medications.                           - Repeat colonoscopy is recommended. The                            colonoscopy date will be determined after pathology                            results from today's exam become available for                            review.                           - Note Mom had colon cancer dx at 30 so he is not                            necessarily at increased risk from that Gatha Mayer, MD 06/13/2018 10:36:12 AM This report has been signed electronically.

## 2018-06-13 NOTE — Patient Instructions (Addendum)
   I found 3  small polyps. I will let you know pathology results and when to have another routine colonoscopy by mail and/or My Chart.  All else normal.  I appreciate the opportunity to care for you. Gatha Mayer, MD, [facility]   Discharge instructions given. Handout on polyps. Resume previous medications. YOU HAD AN ENDOSCOPIC PROCEDURE TODAY AT [facility]:   Refer to the procedure report that was given to you for any specific questions about what was found during the examination.  If the procedure report does not answer your questions, please call your gastroenterologist to clarify.  If you requested that your care partner not be given the details of your procedure findings, then the procedure report has been included in a sealed envelope for you to review at your convenience later.  YOU SHOULD EXPECT: Some feelings of bloating in the abdomen. Passage of more gas than usual.  Walking can help get rid of the air that was put into your GI tract during the procedure and reduce the bloating. If you had a lower endoscopy (such as a colonoscopy or flexible sigmoidoscopy) you may notice spotting of blood in your stool or on the toilet paper. If you underwent a bowel prep for your procedure, you may not have a normal bowel movement for a few days.  Please Note:  You might notice some irritation and congestion in your nose or some drainage.  This is from the oxygen used during your procedure.  There is no need for concern and it should clear up in a day or so.  SYMPTOMS TO REPORT IMMEDIATELY:   Following lower endoscopy (colonoscopy or flexible sigmoidoscopy):  Excessive amounts of blood in the stool  Significant tenderness or worsening of abdominal pains  Swelling of the abdomen that is new, acute  Fever of 100F or higher   For urgent or emergent issues, a gastroenterologist can be reached at any hour by calling [phone].   DIET:  We do recommend a small meal at  first, but then you may proceed to your regular diet.  Drink plenty of fluids but you should avoid alcoholic beverages for 24 hours.  ACTIVITY:  You should plan to take it easy for the rest of today and you should NOT DRIVE or use heavy machinery until tomorrow (because of the sedation medicines used during the test).    FOLLOW UP: Our staff will call the number listed on your records the next business day following your procedure to check on you and address any questions or concerns that you may have regarding the information given to you following your procedure. If we do not reach you, we will leave a message.  However, if you are feeling well and you are not experiencing any problems, there is no need to return our call.  We will assume that you have returned to your regular daily activities without incident.  If any biopsies were taken you will be contacted by phone or by letter within the next 1-3 weeks.  Please call us at [phone] if you have not heard about the biopsies in 3 weeks.    SIGNATURES/CONFIDENTIALITY: You and/or your care partner have signed paperwork which will be entered into your electronic medical record.  These signatures attest to the fact that that the information above on your After Visit Summary has been reviewed and is understood.  Full responsibility of the confidentiality of this discharge information lies with you and/or your care-partner.

## 2018-06-13 NOTE — Progress Notes (Signed)
Pt's states no medical or surgical changes since previsit or office visit. 

## 2018-06-13 NOTE — Progress Notes (Signed)
Called to room to assist during endoscopic procedure.  Patient ID and intended procedure confirmed with present staff. Received instructions for my participation in the procedure from the performing physician.  

## 2018-06-13 NOTE — Progress Notes (Signed)
Report to PACU, RN, vss, BBS= Clear.  

## 2018-06-14 ENCOUNTER — Telehealth: Payer: Self-pay

## 2018-06-14 NOTE — Telephone Encounter (Signed)
  Follow up Call-  Call back number 06/13/2018  Post procedure Call Back phone  # [phone]  Permission to leave phone message Yes  Some recent data might be hidden     Patient questions:  Do you have a fever, pain , or abdominal swelling? No. Pain Score  0 *  Have you tolerated food without any problems? Yes.    Have you been able to return to your normal activities? Yes.    Do you have any questions about your discharge instructions: Diet   No. Medications  No. Follow up visit  No.  Do you have questions or concerns about your Care? No.  Actions: * If pain score is 4 or above: No action needed, pain <4.

## 2018-06-19 ENCOUNTER — Other Ambulatory Visit: Payer: Self-pay | Admitting: Internal Medicine

## 2018-06-19 MED FILL — OLMESARTAN-HCTZ 40-25 MG TA: 40-25 | 90 days supply | Qty: 90 | Fill #0

## 2018-06-19 MED FILL — FENOFIBRATE 145 MG TAB: 145 | 90 days supply | Qty: 90 | Fill #1

## 2018-06-27 MED FILL — SYNTHROID 112 MCG TABLET: 112 | 90 days supply | Qty: 90 | Fill #0

## 2018-06-29 ENCOUNTER — Encounter: Payer: Self-pay | Admitting: Internal Medicine

## 2018-06-29 NOTE — Progress Notes (Signed)
1 adenoma 1 ssp 1 mucosal polyp  Recall 2024  My Chart

## 2018-07-14 LAB — BASIC METABOLIC PANEL WITH GFR
Anion gap: 9 (ref 5–15)
BUN: 17 mg/dL (ref 6–20)
CO2: 25 mmol/L (ref 22–32)
Calcium: 9.7 mg/dL (ref 8.9–10.3)
Chloride: 104 mmol/L (ref 98–111)
Creatinine, Ser: 1.38 mg/dL — ABNORMAL HIGH (ref 0.61–1.24)
GFR calc Af Amer: >60 mL/min
GFR calc non Af Amer: 58 mL/min — ABNORMAL LOW
Glucose, Bld: 116 mg/dL — ABNORMAL HIGH (ref 70–99)
Potassium: 3.7 mmol/L (ref 3.5–5.1)
Sodium: 138 mmol/L (ref 135–145)

## 2018-07-17 ENCOUNTER — Telehealth: Payer: Self-pay | Admitting: *Deleted

## 2018-07-17 NOTE — Telephone Encounter (Signed)
Advised patient of lab results and medication changes, verbalized understanding  

## 2018-07-17 NOTE — Telephone Encounter (Signed)
-----   Message from Skeet Latch, MD sent at 07/17/2018 10:10 AM EDT ----- Renal function gradually getting worse and BP has been on the low side.  Reduce olmesartan/HCTZ to 1/2 tablet.  Really hydrate today and tomorrow.

## 2018-07-18 ENCOUNTER — Ambulatory Visit (HOSPITAL_COMMUNITY)
Admission: RE | Admit: 2018-07-18 | Discharge: 2018-07-18 | Disposition: A | Discharge status: Home / Self Care | Payer: 59 | Source: Ambulatory Visit | Attending: Cardiovascular Disease | Admitting: Cardiovascular Disease

## 2018-07-18 ENCOUNTER — Ambulatory Visit (HOSPITAL_COMMUNITY): Payer: 59

## 2018-07-18 ENCOUNTER — Encounter (HOSPITAL_COMMUNITY): Payer: Self-pay

## 2018-07-18 DIAGNOSIS — Z8249 Family history of ischemic heart disease and other diseases of the circulatory system: Secondary | ICD-10-CM | POA: Diagnosis not present

## 2018-07-18 DIAGNOSIS — K760 Fatty (change of) liver, not elsewhere classified: Secondary | ICD-10-CM | POA: Diagnosis not present

## 2018-07-18 DIAGNOSIS — R0789 Other chest pain: Secondary | ICD-10-CM | POA: Diagnosis not present

## 2018-07-18 DIAGNOSIS — R079 Chest pain, unspecified: Secondary | ICD-10-CM | POA: Diagnosis not present

## 2018-07-18 MED ORDER — METOPROLOL TARTRATE 5 MG/5ML IV SOLN
5.0000 mg | INTRAVENOUS | Status: DC | PRN
  Administered 2018-07-18 (×2): 5 mg via INTRAVENOUS

## 2018-07-18 MED ORDER — IOPAMIDOL (ISOVUE-370) INJECTION 76%
100.0000 mL | Freq: Once | INTRAVENOUS | Status: AC | PRN
  Administered 2018-07-18: 90 mL via INTRAVENOUS

## 2018-07-18 MED ORDER — NITROGLYCERIN 0.4 MG SL SUBL
SUBLINGUAL_TABLET | SUBLINGUAL | Status: AC
  Administered 2018-07-18: 0.8 mg via SUBLINGUAL
  Filled 2018-07-18: qty 2

## 2018-07-18 MED ORDER — NITROGLYCERIN 0.4 MG SL SUBL
0.8000 mg | SUBLINGUAL_TABLET | Freq: Once | SUBLINGUAL | Status: AC
  Administered 2018-07-18: 0.8 mg via SUBLINGUAL

## 2018-07-18 MED ORDER — METOPROLOL TARTRATE 5 MG/5ML IV SOLN
INTRAVENOUS | Status: AC
  Administered 2018-07-18: 5 mg via INTRAVENOUS
  Filled 2018-07-18: qty 10

## 2018-07-18 NOTE — Progress Notes (Signed)
Patient tolerated CT without incident. Patient drank a soda and ate peanut butter crackers. Ambulatory steady gait to exit.

## 2018-08-21 MED FILL — ROSUVASTATIN CALCIUM 5 MG T: 5 | 90 days supply | Qty: 90 | Fill #3

## 2018-10-11 MED FILL — metFORMIN HCL 500 MG TABS: 500 | 90 days supply | Qty: 180 | Fill #1

## 2018-10-25 ENCOUNTER — Other Ambulatory Visit: Payer: Self-pay | Admitting: Internal Medicine

## 2018-10-25 MED FILL — AMLODIPINE BESYLATE 5 MG TA: 5 | 90 days supply | Qty: 90 | Fill #1

## 2018-10-25 MED FILL — OLMESARTAN-HCTZ 40-25 MG TA: 40-25 | 90 days supply | Qty: 90 | Fill #1

## 2018-10-26 MED FILL — SYNTHROID 112 MCG TABLET: 112 | 90 days supply | Qty: 90 | Fill #0

## 2018-10-26 MED FILL — FENOFIBRATE 145 MG TABLET: 145 | 90 days supply | Qty: 90 | Fill #0

## 2018-11-06 ENCOUNTER — Other Ambulatory Visit: Payer: 59 | Admitting: Internal Medicine

## 2018-11-06 DIAGNOSIS — R7302 Impaired glucose tolerance (oral): Secondary | ICD-10-CM

## 2018-11-06 DIAGNOSIS — E781 Pure hyperglyceridemia: Secondary | ICD-10-CM

## 2018-11-06 DIAGNOSIS — E039 Hypothyroidism, unspecified: Secondary | ICD-10-CM | POA: Diagnosis not present

## 2018-11-07 LAB — LIPID PANEL
Cholesterol: 187 mg/dL (ref <200)
HDL: 36 mg/dL — ABNORMAL LOW (ref >=40)
LDL Cholesterol (Calc): 120 mg/dL (calc) — ABNORMAL HIGH
NON-HDL CHOLESTEROL (CALC): 151 mg/dL — AB (ref <130)
Total CHOL/HDL Ratio: 5.2 (calc) — ABNORMAL HIGH (ref <5.0)
Triglycerides: 194 mg/dL — ABNORMAL HIGH (ref <150)

## 2018-11-07 LAB — HEPATIC FUNCTION PANEL
AG Ratio: 2 (calc) (ref 1.0–2.5)
ALT: 65 U/L — AB (ref 9–46)
AST: 41 U/L — ABNORMAL HIGH (ref 10–35)
Albumin: 4.7 g/dL (ref 3.6–5.1)
Alkaline phosphatase (APISO): 44 U/L (ref 35–144)
Bilirubin, Direct: 0.1 mg/dL (ref 0.0–0.2)
GLOBULIN: 2.3 g/dL (ref 1.9–3.7)
Indirect Bilirubin: 0.4 mg/dL (calc) (ref 0.2–1.2)
TOTAL PROTEIN: 7 g/dL (ref 6.1–8.1)
Total Bilirubin: 0.5 mg/dL (ref 0.2–1.2)

## 2018-11-07 LAB — MICROALBUMIN / CREATININE URINE RATIO: Creatinine, Urine: 18 mg/dL — ABNORMAL LOW (ref 20–320)

## 2018-11-07 LAB — TSH: TSH: 3.34 m[IU]/L (ref 0.40–4.50)

## 2018-11-07 LAB — HEMOGLOBIN A1C
Hgb A1c MFr Bld: 6.3 % of total Hgb — ABNORMAL HIGH (ref <5.7)
Mean Plasma Glucose: 134 (calc)
eAG (mmol/L): 7.4 (calc)

## 2018-11-07 MED FILL — VALACYCLOVIR HCL 500 MG TAB: 500 | 5 days supply | Qty: 10 | Fill #1

## 2018-11-07 MED FILL — ROSUVASTATIN CALCIUM 5 MG T: 5 | 90 days supply | Qty: 90 | Fill #0 | Status: TO

## 2018-11-08 ENCOUNTER — Ambulatory Visit: Payer: 59 | Admitting: Internal Medicine

## 2018-11-08 ENCOUNTER — Encounter: Payer: Self-pay | Admitting: Internal Medicine

## 2018-11-08 VITALS — BP 100/80 | HR 93 | Temp 98.2°F | Ht 70.5 in | Wt 229.0 lb

## 2018-11-08 DIAGNOSIS — I10 Essential (primary) hypertension: Secondary | ICD-10-CM

## 2018-11-08 DIAGNOSIS — R7302 Impaired glucose tolerance (oral): Secondary | ICD-10-CM | POA: Diagnosis not present

## 2018-11-08 DIAGNOSIS — E039 Hypothyroidism, unspecified: Secondary | ICD-10-CM

## 2018-11-08 DIAGNOSIS — Z6832 Body mass index (BMI) 32.0-32.9, adult: Secondary | ICD-10-CM | POA: Diagnosis not present

## 2018-11-08 DIAGNOSIS — E782 Mixed hyperlipidemia: Secondary | ICD-10-CM | POA: Diagnosis not present

## 2018-11-08 DIAGNOSIS — E786 Lipoprotein deficiency: Secondary | ICD-10-CM | POA: Diagnosis not present

## 2018-11-08 DIAGNOSIS — E8881 Metabolic syndrome: Secondary | ICD-10-CM | POA: Diagnosis not present

## 2018-11-08 DIAGNOSIS — J069 Acute upper respiratory infection, unspecified: Secondary | ICD-10-CM | POA: Diagnosis not present

## 2018-11-08 NOTE — Progress Notes (Signed)
   Subjective:    Patient ID: Benjamin Lopez, male    DOB: November 06, 1965, 53 y.o.   MRN: [id number]  HPI  53 year Male for 6 month recheck.  Had colonoscopy by Dr. Carlean Purl with 1 adenoma and 2 benign polyps.  Five-year follow-up was recommended.  Around the time of his colonoscopy he had a B met and creatinine was 1.38.  This has not been repeated.  Needs to be repeated.  His creatinine was normal in June 2019 at 1.23.  He is on HCTZ and Benicar in addition to amlodipine.  He is on Crestor.  He is on TriCor.  Not really motivated to diet and exercise at the present time.  Working a lot of hours.  Unfortunately triglycerides are elevated at 194, cholesterol 187 and LDL cholesterol 120.  He is on low-dose statin. Hemoglobin A1c 6.3%.  TSH is 3.34.  In June 2019 hemoglobin A1c was 6.2%   Review of Systems has come down with respiratory infection and has sinus congestion.     Objective:   Physical Exam  Neck supple.  Chest clear to auscultation.  Cardiac exam regular rate and rhythm.  Extremities without edema. Blood pressure 100/80.  Temperature 98.2 degrees weight 229 pounds.  BMI 32.39.  TMs and pharynx are clear.  Neck is without adenopathy.     Assessment & Plan:  Acute URI-symptomatic treatment no antibiotics prescribed  Essential hypertension  Family history of coronary disease  Diabetes mellitus-control could be better  History of elevated creatinine around the time of colonoscopy September 2019-B-met needs to be repeated he will be contacted  Hyperlipidemia-mixed is on low-dose statin therapy  Low HDL less than 40  Plan: Continue Metformin.  Needs to monitor Accu-Cheks.  Currently on olmesartan HCTZ 40/25 and amlodipine 5 mg daily.  B met needs to be repeated.  Remains on Crestor 5 mg daily.  This could be increased if necessary.  Is on Synthroid for hypothyroidism.  Have plans to follow-up with him in May but needs to have B met drawn in the near future.

## 2018-11-25 NOTE — Patient Instructions (Signed)
Please work on diet exercise and weight loss.  Monitor Accu-Cheks.  Basic metabolic panel needs to be repeated due to elevated serum creatinine around the time of colonoscopy.  Continue to monitor blood pressure.  No antibiotics given today for respiratory infection.  Continue thyroid replacement.  Has follow-up appointment in May but in the meantime would like to repeat basic metabolic panel because of elevated creatinine of 1.38 during September colonoscopy.

## 2018-11-26 ENCOUNTER — Telehealth: Payer: Self-pay

## 2018-11-26 NOTE — Telephone Encounter (Signed)
Left message patient needs to come in for a BMET for elevated creatinine.

## 2018-11-29 ENCOUNTER — Other Ambulatory Visit: Payer: 59 | Admitting: Internal Medicine

## 2018-11-29 DIAGNOSIS — R7989 Other specified abnormal findings of blood chemistry: Secondary | ICD-10-CM

## 2018-11-29 LAB — BASIC METABOLIC PANEL
BUN: 14 mg/dL (ref 7–25)
CO2: 26 mmol/L (ref 20–32)
CREATININE: 1.17 mg/dL (ref 0.70–1.33)
Calcium: 10.6 mg/dL — ABNORMAL HIGH (ref 8.6–10.3)
Chloride: 105 mmol/L (ref 98–110)
Glucose, Bld: 84 mg/dL (ref 65–99)
POTASSIUM: 4.2 mmol/L (ref 3.5–5.3)
Sodium: 142 mmol/L (ref 135–146)

## 2018-12-03 ENCOUNTER — Other Ambulatory Visit: Payer: 59 | Admitting: Internal Medicine

## 2018-12-04 LAB — PTH, INTACT AND CALCIUM
CALCIUM: 10.4 mg/dL — AB (ref 8.6–10.3)
PTH: 31 pg/mL (ref 14–64)

## 2018-12-11 DIAGNOSIS — H524 Presbyopia: Secondary | ICD-10-CM | POA: Diagnosis not present

## 2018-12-11 DIAGNOSIS — H52203 Unspecified astigmatism, bilateral: Secondary | ICD-10-CM | POA: Diagnosis not present

## 2018-12-11 DIAGNOSIS — H5213 Myopia, bilateral: Secondary | ICD-10-CM | POA: Diagnosis not present

## 2018-12-21 ENCOUNTER — Other Ambulatory Visit: Payer: Self-pay | Admitting: Internal Medicine

## 2018-12-21 MED FILL — FENOFIBRATE 145 MG TABLET: 145 | 90 days supply | Qty: 90 | Fill #1

## 2018-12-21 MED FILL — metFORMIN HCL 500 MG TABS: 500 | 90 days supply | Qty: 180 | Fill #2

## 2019-02-01 MED FILL — OLMESARTAN-HCTZ 40-25 MG TA: 40-25 | 30 days supply | Qty: 30 | Fill #0

## 2019-02-01 MED FILL — SYNTHROID 112 MCG TABLET: 112 | 90 days supply | Qty: 90 | Fill #0

## 2019-02-01 MED FILL — ROSUVASTATIN CALCIUM 5 MG T: 5 | 90 days supply | Qty: 90 | Fill #0

## 2019-02-01 MED FILL — AMLODIPINE BESYLATE 5 MG TA: 5 | 90 days supply | Qty: 90 | Fill #0

## 2019-02-08 ENCOUNTER — Other Ambulatory Visit: Payer: 59 | Admitting: Internal Medicine

## 2019-02-11 ENCOUNTER — Other Ambulatory Visit: Payer: Self-pay

## 2019-02-11 ENCOUNTER — Other Ambulatory Visit: Payer: 59 | Admitting: Internal Medicine

## 2019-02-11 DIAGNOSIS — E039 Hypothyroidism, unspecified: Secondary | ICD-10-CM

## 2019-02-11 DIAGNOSIS — R7302 Impaired glucose tolerance (oral): Secondary | ICD-10-CM

## 2019-02-11 DIAGNOSIS — E782 Mixed hyperlipidemia: Secondary | ICD-10-CM

## 2019-02-12 ENCOUNTER — Encounter: Payer: Self-pay | Admitting: Internal Medicine

## 2019-02-12 ENCOUNTER — Ambulatory Visit (INDEPENDENT_AMBULATORY_CARE_PROVIDER_SITE_OTHER): Payer: 59 | Admitting: Internal Medicine

## 2019-02-12 VITALS — BP 117/70

## 2019-02-12 DIAGNOSIS — E8881 Metabolic syndrome: Secondary | ICD-10-CM

## 2019-02-12 DIAGNOSIS — E039 Hypothyroidism, unspecified: Secondary | ICD-10-CM | POA: Diagnosis not present

## 2019-02-12 DIAGNOSIS — R7302 Impaired glucose tolerance (oral): Secondary | ICD-10-CM

## 2019-02-12 DIAGNOSIS — E782 Mixed hyperlipidemia: Secondary | ICD-10-CM | POA: Diagnosis not present

## 2019-02-12 DIAGNOSIS — I10 Essential (primary) hypertension: Secondary | ICD-10-CM

## 2019-02-12 LAB — HEPATIC FUNCTION PANEL
AG Ratio: 2.5 (calc) (ref 1.0–2.5)
ALT: 48 U/L — ABNORMAL HIGH (ref 9–46)
AST: 29 U/L (ref 10–35)
Albumin: 5 g/dL (ref 3.6–5.1)
Alkaline phosphatase (APISO): 33 U/L — ABNORMAL LOW (ref 35–144)
Bilirubin, Direct: 0.1 mg/dL (ref 0.0–0.2)
Globulin: 2 g/dL (calc) (ref 1.9–3.7)
Indirect Bilirubin: 0.4 mg/dL (calc) (ref 0.2–1.2)
Total Bilirubin: 0.5 mg/dL (ref 0.2–1.2)
Total Protein: 7 g/dL (ref 6.1–8.1)

## 2019-02-12 LAB — TSH: TSH: 1.87 mIU/L (ref 0.40–4.50)

## 2019-02-12 LAB — LIPID PANEL
Cholesterol: 153 mg/dL (ref <200)
HDL: 35 mg/dL — ABNORMAL LOW (ref >=40)
LDL Cholesterol (Calc): 92 mg/dL (calc)
Non-HDL Cholesterol (Calc): 118 mg/dL (calc) (ref <130)
Total CHOL/HDL Ratio: 4.4 (calc) (ref <5.0)
Triglycerides: 166 mg/dL — ABNORMAL HIGH (ref <150)

## 2019-02-12 LAB — HEMOGLOBIN A1C
Hgb A1c MFr Bld: 6.1 % of total Hgb — ABNORMAL HIGH (ref <5.7)
Mean Plasma Glucose: 128 (calc)
eAG (mmol/L): 7.1 (calc)

## 2019-02-12 MED ORDER — FENOFIBRATE 145 MG PO TABS: 145.0000 mg | ORAL_TABLET | Freq: Every day | ORAL | 1 refills | Status: DC

## 2019-02-12 MED ORDER — VALACYCLOVIR HCL 500 MG PO TABS: 500.0000 mg | ORAL_TABLET | Freq: Two times a day (BID) | ORAL | 11 refills | Status: DC

## 2019-02-12 MED ORDER — OLMESARTAN MEDOXOMIL-HCTZ 40-25 MG PO TABS: 1.0000 | ORAL_TABLET | Freq: Every day | ORAL | 1 refills | Status: DC

## 2019-02-12 MED ORDER — SYNTHROID 112 MCG PO TABS: 112.0000 ug | ORAL_TABLET | Freq: Every day | ORAL | 1 refills | Status: DC

## 2019-02-12 MED ORDER — CYCLOBENZAPRINE HCL 10 MG PO TABS: 10.0000 mg | ORAL_TABLET | Freq: Every day | ORAL | 2 refills | Status: DC

## 2019-02-12 MED ORDER — ROSUVASTATIN CALCIUM 5 MG PO TABS: 5.0000 mg | ORAL_TABLET | Freq: Every day | ORAL | 3 refills | Status: DC

## 2019-02-12 MED ORDER — METFORMIN HCL 500 MG PO TABS: 500.0000 mg | ORAL_TABLET | Freq: Two times a day (BID) | ORAL | 3 refills | Status: DC

## 2019-02-12 MED ORDER — AMLODIPINE BESYLATE 5 MG PO TABS: 5.0000 mg | ORAL_TABLET | Freq: Every day | ORAL | 1 refills | Status: DC

## 2019-02-12 MED FILL — CYCLOBENZAPRINE HCL 10 MG T: 10 | 30 days supply | Qty: 30 | Fill #0

## 2019-02-12 MED FILL — VALACYCLOVIR HCL 500 MG TAB: 500 | 5 days supply | Qty: 10 | Fill #0

## 2019-03-07 MED FILL — OLMESARTAN-HCTZ 40-25 MG TA: 40-25 | 30 days supply | Qty: 30 | Fill #0

## 2019-03-20 NOTE — Patient Instructions (Signed)
It was a pleasure to see you today by virtual visit.  Continue to work on diet and exercise.  Continue current medications.  Physical exam can be scheduled for early fall 2020.

## 2019-03-20 NOTE — Progress Notes (Signed)
   Subjective:    Patient ID: Benjamin Lopez, male    DOB: May 09, 1966, 53 y.o.   MRN: [id number]  HPI 53 year old Male seen today by interactive audio and video telecommunications due to the coronavirus pandemic.  He is identified as Benjamin Lopez using 2 identifiers, a longstanding patient in this practice.  He is agreeable to visit in this format today.  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.  Reports blood pressure to be stable on current regimen.  In March, he had a mildly elevated calcium at 10.4 and we checked his intact PTH was normal at 31.  He has impaired glucose tolerance.  Hemoglobin A1c has improved from 6.3% in February to 6.1%.  Is on metformin.  Currently prescribed twice daily.  For hypertension he takes amlodipine as well as Benicar HCTZ.  He is on Crestor 5 mg daily.  He has hypothyroidism and currently is on Synthroid 0.112 mg daily.  TSH is normal.  Liver panel has improved.  At last visit SGOT was 41 and is now 29.  SGPT was 65 at last visit and is now 21.  Triglycerides have improved from 194 in February to 166.  LDL has improved from 120 to 92.  Total cholesterol is 153.  Says his children are doing well and is been trying to get out with him in get some exercise.    Review of Systems No new complaints    Objective:   Physical Exam  Seen by virtual visit and not examined      Assessment & Plan:  Essential hypertension-patient reports stable blood pressure on current regimen.  Hyperlipidemia-improved triglycerides and LDL cholesterol as well as total cholesterol.  Hypothyroidism-stable on thyroid replacement therapy  Impaired glucose tolerance treated with metformin and stable at 6.1%.  Plan: Continue to encourage diet exercise and weight loss and follow-up in the fall for health maintenance exam.  25 minutes spent reviewing records prior to office visit in addition to recent lab results and also time spent with virtual visit  with patient

## 2019-04-16 MED FILL — SYNTHROID 112 MCG TABLET: 112 | 90 days supply | Qty: 90 | Fill #0

## 2019-04-16 MED FILL — OLMESARTAN-HCTZ 40-25 MG TA: 40-25 | 30 days supply | Qty: 30 | Fill #1

## 2019-04-17 MED FILL — FENOFIBRATE 145 MG TABS: 145 | 90 days supply | Qty: 90 | Fill #0

## 2019-04-17 MED FILL — metFORMIN HCL 500 MG TABS: 500 | 90 days supply | Qty: 180 | Fill #0

## 2019-04-17 MED FILL — ROSUVASTATIN CALCIUM 5 MG T: 5 | 90 days supply | Qty: 90 | Fill #1

## 2019-04-17 MED FILL — VALACYCLOVIR HCL 500 MG TAB: 500 | 5 days supply | Qty: 10 | Fill #1

## 2019-04-17 MED FILL — AMLODIPINE BESYLATE 5 MG TA: 5 | 90 days supply | Qty: 90 | Fill #1

## 2019-05-14 MED FILL — OLMESARTAN-HCTZ 40-25 MG TA: 40-25 | 30 days supply | Qty: 30 | Fill #2

## 2019-06-21 MED FILL — OLMESARTAN-HCTZ 40-25 MG TA: 40-25 | 30 days supply | Qty: 30 | Fill #3

## 2019-06-25 ENCOUNTER — Other Ambulatory Visit: Payer: 59 | Admitting: Internal Medicine

## 2019-06-27 ENCOUNTER — Other Ambulatory Visit: Payer: Self-pay

## 2019-06-27 ENCOUNTER — Other Ambulatory Visit: Payer: 59 | Admitting: Internal Medicine

## 2019-06-27 DIAGNOSIS — E782 Mixed hyperlipidemia: Secondary | ICD-10-CM | POA: Diagnosis not present

## 2019-06-27 DIAGNOSIS — E039 Hypothyroidism, unspecified: Secondary | ICD-10-CM | POA: Diagnosis not present

## 2019-06-27 DIAGNOSIS — E8881 Metabolic syndrome: Secondary | ICD-10-CM | POA: Diagnosis not present

## 2019-06-27 DIAGNOSIS — R7302 Impaired glucose tolerance (oral): Secondary | ICD-10-CM | POA: Diagnosis not present

## 2019-06-27 DIAGNOSIS — I10 Essential (primary) hypertension: Secondary | ICD-10-CM | POA: Diagnosis not present

## 2019-06-28 LAB — CBC WITH DIFFERENTIAL/PLATELET
Absolute Monocytes: 319 cells/uL (ref 200–950)
Basophils Absolute: 21 cells/uL (ref 0–200)
Basophils Relative: 0.5 %
Eosinophils Absolute: 71 cells/uL (ref 15–500)
Eosinophils Relative: 1.7 %
HCT: 42.5 % (ref 38.5–50.0)
Hemoglobin: 14.3 g/dL (ref 13.2–17.1)
Lymphs Abs: 1852 cells/uL (ref 850–3900)
MCH: 27.2 pg (ref 27.0–33.0)
MCHC: 33.6 g/dL (ref 32.0–36.0)
MCV: 80.8 fL (ref 80.0–100.0)
MPV: 10.2 fL (ref 7.5–12.5)
Monocytes Relative: 7.6 %
Neutro Abs: 1936 cells/uL (ref 1500–7800)
Neutrophils Relative %: 46.1 %
Platelets: 290 10*3/uL (ref 140–400)
RBC: 5.26 10*6/uL (ref 4.20–5.80)
RDW: 12.2 % (ref 11.0–15.0)
Total Lymphocyte: 44.1 %
WBC: 4.2 10*3/uL (ref 3.8–10.8)

## 2019-06-28 LAB — LIPID PANEL
Cholesterol: 153 mg/dL (ref <200)
HDL: 39 mg/dL — ABNORMAL LOW (ref >=40)
LDL Cholesterol (Calc): 92 mg/dL (calc)
Non-HDL Cholesterol (Calc): 114 mg/dL (calc) (ref <130)
Total CHOL/HDL Ratio: 3.9 (calc) (ref <5.0)
Triglycerides: 122 mg/dL (ref <150)

## 2019-06-28 LAB — COMPLETE METABOLIC PANEL WITH GFR
AG Ratio: 2 (calc) (ref 1.0–2.5)
ALT: 40 U/L (ref 9–46)
AST: 35 U/L (ref 10–35)
Albumin: 4.7 g/dL (ref 3.6–5.1)
Alkaline phosphatase (APISO): 34 U/L — ABNORMAL LOW (ref 35–144)
BUN: 19 mg/dL (ref 7–25)
CO2: 27 mmol/L (ref 20–32)
Calcium: 9.9 mg/dL (ref 8.6–10.3)
Chloride: 103 mmol/L (ref 98–110)
Creat: 1.2 mg/dL (ref 0.70–1.33)
GFR, Est African American: 81 mL/min/{1.73_m2} (ref >=60)
GFR, Est Non African American: 70 mL/min/{1.73_m2} (ref >=60)
Globulin: 2.4 g/dL (calc) (ref 1.9–3.7)
Glucose, Bld: 113 mg/dL — ABNORMAL HIGH (ref 65–99)
Potassium: 4.1 mmol/L (ref 3.5–5.3)
Sodium: 140 mmol/L (ref 135–146)
Total Bilirubin: 0.5 mg/dL (ref 0.2–1.2)
Total Protein: 7.1 g/dL (ref 6.1–8.1)

## 2019-06-28 LAB — HEMOGLOBIN A1C
Hgb A1c MFr Bld: 6.2 % of total Hgb — ABNORMAL HIGH (ref <5.7)
Mean Plasma Glucose: 131 (calc)
eAG (mmol/L): 7.3 (calc)

## 2019-06-28 LAB — PSA: PSA: 0.6 ng/mL (ref <=4.0)

## 2019-06-28 LAB — TSH: TSH: 2.42 mIU/L (ref 0.40–4.50)

## 2019-07-02 ENCOUNTER — Encounter: Payer: Self-pay | Admitting: Internal Medicine

## 2019-07-02 ENCOUNTER — Other Ambulatory Visit: Payer: Self-pay

## 2019-07-02 ENCOUNTER — Ambulatory Visit (INDEPENDENT_AMBULATORY_CARE_PROVIDER_SITE_OTHER): Payer: 59 | Admitting: Internal Medicine

## 2019-07-02 VITALS — BP 90/68 | HR 72 | Temp 98.6°F | Ht 71.0 in | Wt 222.0 lb

## 2019-07-02 DIAGNOSIS — E039 Hypothyroidism, unspecified: Secondary | ICD-10-CM | POA: Diagnosis not present

## 2019-07-02 DIAGNOSIS — Z0000 Encounter for general adult medical examination without abnormal findings: Secondary | ICD-10-CM

## 2019-07-02 DIAGNOSIS — E8881 Metabolic syndrome: Secondary | ICD-10-CM | POA: Diagnosis not present

## 2019-07-02 DIAGNOSIS — R7302 Impaired glucose tolerance (oral): Secondary | ICD-10-CM | POA: Diagnosis not present

## 2019-07-02 DIAGNOSIS — Z6830 Body mass index (BMI) 30.0-30.9, adult: Secondary | ICD-10-CM

## 2019-07-02 DIAGNOSIS — B009 Herpesviral infection, unspecified: Secondary | ICD-10-CM

## 2019-07-02 DIAGNOSIS — I10 Essential (primary) hypertension: Secondary | ICD-10-CM | POA: Diagnosis not present

## 2019-07-02 DIAGNOSIS — E786 Lipoprotein deficiency: Secondary | ICD-10-CM | POA: Diagnosis not present

## 2019-07-02 DIAGNOSIS — Z23 Encounter for immunization: Secondary | ICD-10-CM | POA: Diagnosis not present

## 2019-07-02 DIAGNOSIS — K219 Gastro-esophageal reflux disease without esophagitis: Secondary | ICD-10-CM

## 2019-07-02 DIAGNOSIS — Z800 Family history of malignant neoplasm of digestive organs: Secondary | ICD-10-CM | POA: Diagnosis not present

## 2019-07-02 DIAGNOSIS — E782 Mixed hyperlipidemia: Secondary | ICD-10-CM | POA: Diagnosis not present

## 2019-07-02 LAB — POCT URINALYSIS DIPSTICK
Appearance: NEGATIVE
Bilirubin, UA: NEGATIVE
Blood, UA: NEGATIVE
Glucose, UA: NEGATIVE
Ketones, UA: NEGATIVE
Leukocytes, UA: NEGATIVE
Nitrite, UA: NEGATIVE
Odor: NEGATIVE
Protein, UA: NEGATIVE
Spec Grav, UA: 1.01 (ref 1.010–1.025)
Urobilinogen, UA: 0.2 E.U./dL
pH, UA: 6.5 (ref 5.0–8.0)

## 2019-07-02 NOTE — Progress Notes (Signed)
   Subjective:    Patient ID: Benjamin Lopez, male    DOB: 05/21/1966, 53 y.o.   MRN: [id number]  HPI 53 year old Male in today for health maintenance exam and evaluation of medical issues.  Recent labs show a hemoglobin A1c of 6.2% and previously was 6.1% in May.  His PSA is normal.  His TSH is normal.  He has a low HDL of 39 but lipids are normal. Fasting glucose is 113.  His mother has a history of diabetes mellitus.  He is on Crestor 5 mg daily.  He has a history of hypothyroidism treated with Synthroid 0.112 mg daily.  He is on olmesartan HCTZ 40/25 and amlodipine 5 mg daily for hypertension.  In addition to Crestor he takes fenofibrate 145 mg daily for hypertriglyceridemia.  Work is been stressful during the pandemic.  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  He had colonoscopy in 2019.  He had one tubular adenoma.  He will get flu vaccine through employment  Social history: He is married.  He has 2 daughters.  He does not smoke.  Social alcohol consumption.  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  Family history: Mother with history of coronary artery disease, impaired glucose tolerance, colon cancer, diabetes mellitus, hypertension COPD and hyperlipidemia as well as osteoarthritis.  Father's family history is unknown.  Father's history is unknown.  He is an only child.  Maternal aunt died from complications of COPD but had history of smoking, diabetes, breast cancer and hyperlipidemia.  No known drug allergies.  Past medical history: Fractured right clavicle 1981, fractured right tibia in 1983, mononucleosis at age 89.  Poststreptococcal glomerulonephritis 1986.  Fractured right elbow 1998.  Bilateral hydrocele repair 1971.  History of duplication of the left collecting system.   Review of Systems no new complaints.  No chest pain shortness of breath, cough, fever chills or significant musculoskeletal pain.  No abdominal pain.  No headaches.  No  depression.     Objective:   Physical Exam Blood pressure 90/68 pulse 72 regular, temperature 98.6 degrees orally weight 222 pounds.  BMI 30.96.  Height 5 feet 11 inches.  Skin warm and dry.  Nodes none.  TMs and pharynx are clear.  Neck is supple without JVD thyromegaly or carotid bruits.  Chest clear to auscultation.  Cardiac exam regular rate and rhythm normal S1 and S2 without murmurs rubs or gallops.  Abdomen soft nondistended without hepatosplenomegaly masses or tenderness.  No lower extremity edema.  Prostate is normal without masses.  Neuro is intact without focal deficits.  Affect judgment and thought process normal       Assessment & Plan:  Essential hypertension-stable on current regimen of amlodipine and olmesartan HCTZ  Impaired glucose tolerance-continue to work on diet and exercise-continue Metformin 500 mg twice daily  Mixed hyperlipidemia treated with fenofibrate and generic Crestor  Hypothyroidism treated with thyroid replacement medication  History of Herpes simplex treated with as needed Valtrex  Plan: Continue current regimen and follow-up in 6 months.  Please take good care of yourself and get adequate rest.  Try to get some exercise several days outside of work weekly.  Due to impaired glucose tolerance, patient request Prevnar 13 vaccine and this was given.  He will have flu vaccine through employment.

## 2019-07-02 NOTE — Patient Instructions (Addendum)
Continue to work on diet and exercise. RTC 6 months.  Prevnar 13 vaccine given today.  Have flu vaccine through employment.  Continue current medications.

## 2019-07-29 MED FILL — SYNTHROID 112 MCG TABLET: 112 | 90 days supply | Qty: 90 | Fill #1

## 2019-07-29 MED FILL — OLMESARTAN-HCTZ 40-25 MG TA: 40-25 | 30 days supply | Qty: 30 | Fill #4

## 2019-09-03 MED FILL — OLMESARTAN-HCTZ 40-25 MG TA: 40-25 | 30 days supply | Qty: 30 | Fill #5

## 2019-09-03 MED FILL — FENOFIBRATE 145 MG TABLET: 145 | 90 days supply | Qty: 90 | Fill #1

## 2019-09-03 MED FILL — metFORMIN HCL 500 MG TABS: 500 | 90 days supply | Qty: 180 | Fill #1

## 2019-09-03 MED FILL — ROSUVASTATIN CALCIUM 5 MG T: 5 | 90 days supply | Qty: 90 | Fill #0

## 2019-10-09 ENCOUNTER — Other Ambulatory Visit: Payer: Self-pay | Admitting: Internal Medicine

## 2019-10-09 MED FILL — OLMESARTAN-HCTZ 40-25 MG TA: 40-25 | 30 days supply | Qty: 30 | Fill #1

## 2019-10-09 MED FILL — AMLODIPINE BESYLATE 5 MG TA: 5 | 90 days supply | Qty: 90 | Fill #0

## 2019-11-19 ENCOUNTER — Other Ambulatory Visit: Payer: Self-pay | Admitting: Internal Medicine

## 2019-11-19 MED FILL — SYNTHROID 112 MCG TABLET: 112 | 90 days supply | Qty: 90 | Fill #0

## 2019-11-19 MED FILL — OLMESARTAN-HCTZ 40-25 MG TA: 40-25 | 30 days supply | Qty: 30 | Fill #2

## 2019-11-19 MED FILL — FENOFIBRATE 145 MG TABS: 145 | 90 days supply | Qty: 90 | Fill #0

## 2019-12-17 ENCOUNTER — Encounter: Payer: Self-pay | Admitting: Internal Medicine

## 2019-12-17 DIAGNOSIS — H5213 Myopia, bilateral: Secondary | ICD-10-CM | POA: Diagnosis not present

## 2019-12-17 DIAGNOSIS — H52203 Unspecified astigmatism, bilateral: Secondary | ICD-10-CM | POA: Diagnosis not present

## 2019-12-17 DIAGNOSIS — H524 Presbyopia: Secondary | ICD-10-CM | POA: Diagnosis not present

## 2019-12-17 LAB — HM DIABETES EYE EXAM

## 2019-12-25 MED FILL — METFORMIN HCL 500 MG TABS: 500 | 90 days supply | Qty: 180 | Fill #2

## 2019-12-25 MED FILL — OLMESARTAN-HCTZ 40-25 MG TA: 40-25 | 30 days supply | Qty: 30 | Fill #0

## 2019-12-31 ENCOUNTER — Other Ambulatory Visit: Payer: 59 | Admitting: Internal Medicine

## 2020-01-02 ENCOUNTER — Ambulatory Visit (INDEPENDENT_AMBULATORY_CARE_PROVIDER_SITE_OTHER): Payer: 59 | Admitting: Internal Medicine

## 2020-01-02 DIAGNOSIS — Z538 Procedure and treatment not carried out for other reasons: Secondary | ICD-10-CM

## 2020-01-09 ENCOUNTER — Other Ambulatory Visit: Payer: Self-pay

## 2020-01-09 ENCOUNTER — Other Ambulatory Visit: Payer: 59 | Admitting: Internal Medicine

## 2020-01-09 DIAGNOSIS — E039 Hypothyroidism, unspecified: Secondary | ICD-10-CM | POA: Diagnosis not present

## 2020-01-09 DIAGNOSIS — R7302 Impaired glucose tolerance (oral): Secondary | ICD-10-CM | POA: Diagnosis not present

## 2020-01-09 DIAGNOSIS — E782 Mixed hyperlipidemia: Secondary | ICD-10-CM | POA: Diagnosis not present

## 2020-01-10 ENCOUNTER — Encounter: Payer: Self-pay | Admitting: Internal Medicine

## 2020-01-10 ENCOUNTER — Ambulatory Visit: Payer: 59 | Admitting: Internal Medicine

## 2020-01-10 VITALS — BP 120/80 | HR 72 | Temp 98.5°F | Ht 71.0 in | Wt 219.0 lb

## 2020-01-10 DIAGNOSIS — E8881 Metabolic syndrome: Secondary | ICD-10-CM | POA: Diagnosis not present

## 2020-01-10 DIAGNOSIS — E039 Hypothyroidism, unspecified: Secondary | ICD-10-CM | POA: Diagnosis not present

## 2020-01-10 DIAGNOSIS — E786 Lipoprotein deficiency: Secondary | ICD-10-CM

## 2020-01-10 DIAGNOSIS — I10 Essential (primary) hypertension: Secondary | ICD-10-CM

## 2020-01-10 DIAGNOSIS — E782 Mixed hyperlipidemia: Secondary | ICD-10-CM | POA: Diagnosis not present

## 2020-01-10 DIAGNOSIS — R7302 Impaired glucose tolerance (oral): Secondary | ICD-10-CM

## 2020-01-10 DIAGNOSIS — Z6830 Body mass index (BMI) 30.0-30.9, adult: Secondary | ICD-10-CM

## 2020-01-10 LAB — HEMOGLOBIN A1C
Hgb A1c MFr Bld: 6.3 % of total Hgb — ABNORMAL HIGH (ref <5.7)
Mean Plasma Glucose: 134 (calc)
eAG (mmol/L): 7.4 (calc)

## 2020-01-10 LAB — LIPID PANEL
Cholesterol: 150 mg/dL (ref <200)
HDL: 38 mg/dL — ABNORMAL LOW (ref >=40)
LDL Cholesterol (Calc): 88 mg/dL (calc)
Non-HDL Cholesterol (Calc): 112 mg/dL (calc) (ref <130)
Total CHOL/HDL Ratio: 3.9 (calc) (ref <5.0)
Triglycerides: 147 mg/dL (ref <150)

## 2020-01-10 LAB — TSH: TSH: 2.25 mIU/L (ref 0.40–4.50)

## 2020-01-10 NOTE — Progress Notes (Signed)
   Subjective:    Patient ID: Benjamin Lopez, male    DOB: 03/31/1966, 54 y.o.   MRN: [id number]  HPI 54 year old Male for follow-up on multiple medical issues including hypothyroidism, hyperlipidemia and essential hypertension.  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  He had colonoscopy in 2019 with one tubular adenoma.  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 Satira Sark in [facility] ophthalmology.  Had eye exam 12/17/2019  TSH is normal on thyroid replacement medication.  Hemoglobin A1c stable at 6.3%  Lipid panel was normal with the exception of a low HDL of 38.  He is on TriCor and Crestor  Review of Systems no new complaints-work continues to be stressful     Objective:   Physical Exam Blood pressure 120/80, pulse 72, temperature 98.5 degrees orally pulse oximetry 98% weight 219 pounds height 5 feet 11 inches BMI 30.54  Skin warm and dry.  No cervical adenopathy.  No JVD.  No thyromegaly.  No carotid bruits.  Chest clear to auscultation.  Cardiac exam regular rate and rhythm normal S1 and S2 without murmurs or gallops.  Abdomen soft nondistended without hepatosplenomegaly masses or tenderness.       Assessment & Plan:  Essential hypertension-stable on current regimen  Mixed hyperlipidemia-continue TriCor and Crestor.  Labs stable.  Low HDL cholesterol  Hypothyroidism-TSH is normal on thyroid replacement medication.  BMI 30.54-watch diet and continue to exercise outside of work as much as possible given job constraints.  Impaired glucose tolerance-stable at 6.3%.  Patient is on Metformin twice daily.  Metabolic syndrome  Plan: Continue current medications.  Try to exercise outside of work is much as possible and follow-up here in 6 months.

## 2020-01-22 MED FILL — ROSUVASTATIN CALCIUM 5 MG T: 5 | 90 days supply | Qty: 90 | Fill #1

## 2020-01-22 MED FILL — OLMESARTAN-HCTZ 40-25 MG TA: 40-25 | 30 days supply | Qty: 30 | Fill #1

## 2020-01-25 ENCOUNTER — Encounter: Payer: Self-pay | Admitting: Internal Medicine

## 2020-01-25 NOTE — Patient Instructions (Signed)
Appointment cancelled by patient.

## 2020-01-25 NOTE — Progress Notes (Signed)
Patient did not keep this appointment.

## 2020-01-27 NOTE — Patient Instructions (Signed)
It was a pleasure to see you today.  Continue current medications and follow-up in 6 months.  Try to exercise as much outside of work as possible.

## 2020-02-17 MED FILL — OLMESARTAN-HCTZ 40-25 MG TA: 40-25 | 30 days supply | Qty: 30 | Fill #2

## 2020-02-17 MED FILL — FENOFIBRATE 145 MG TAB: 145 | 90 days supply | Qty: 90 | Fill #1

## 2020-02-17 MED FILL — SYNTHROID 112 MCG TABLET: 112 | 90 days supply | Qty: 90 | Fill #1

## 2020-03-02 ENCOUNTER — Telehealth: Payer: 59 | Admitting: Family

## 2020-03-02 DIAGNOSIS — L255 Unspecified contact dermatitis due to plants, except food: Secondary | ICD-10-CM

## 2020-03-02 MED ORDER — PREDNISONE 10 MG (21) PO TBPK
ORAL_TABLET | ORAL | 0 refills | Status: DC
Start: 2020-03-02 — End: 2021-07-28

## 2020-03-02 NOTE — Progress Notes (Signed)

## 2020-05-18 ENCOUNTER — Other Ambulatory Visit: Payer: Self-pay | Admitting: Internal Medicine

## 2020-05-18 MED FILL — OLMESARTAN-HCTZ 40-25 MG TA: 40-25 | 30 days supply | Qty: 30 | Fill #3

## 2020-05-18 NOTE — Telephone Encounter (Signed)
Refill through October has CPE then

## 2020-05-19 MED FILL — METFORMIN HCL 500 MG TABS: 500 | 90 days supply | Qty: 180 | Fill #0

## 2020-05-19 MED FILL — SYNTHROID 112 MCG TABLET: 112 | 90 days supply | Qty: 90 | Fill #0

## 2020-05-19 MED FILL — FENOFIBRATE 145 MG TABS: 145 | 90 days supply | Qty: 90 | Fill #0

## 2020-05-19 MED FILL — ROSUVASTATIN CALCIUM 5 MG T: 5 | 90 days supply | Qty: 90 | Fill #0

## 2020-06-17 MED FILL — OLMESARTAN-HCTZ 40-25 MG TA: 40-25 | 30 days supply | Qty: 30 | Fill #4

## 2020-07-15 DIAGNOSIS — Z20822 Contact with and (suspected) exposure to covid-19: Secondary | ICD-10-CM | POA: Diagnosis not present

## 2020-07-20 ENCOUNTER — Telehealth: Payer: Self-pay | Admitting: Internal Medicine

## 2020-07-20 ENCOUNTER — Other Ambulatory Visit: Payer: 59 | Admitting: Internal Medicine

## 2020-07-20 NOTE — Telephone Encounter (Signed)
Car visit

## 2020-07-20 NOTE — Telephone Encounter (Signed)
Benjamin Lopez [phone]  Benjamin Lopez called to say he is having Bronchitis like symptoms, he was tested for COVID-19 on 07/15/2020 and it was negative. However he is still sick, so he was calling to see what to do about CPE tomorrow. He said he would maybe like to be seen and get some medicine for this bronchitis if not his physical.

## 2020-07-20 NOTE — Telephone Encounter (Signed)
scheduled

## 2020-07-21 ENCOUNTER — Encounter: Payer: Self-pay | Admitting: Internal Medicine

## 2020-07-21 ENCOUNTER — Other Ambulatory Visit: Payer: Self-pay

## 2020-07-21 ENCOUNTER — Ambulatory Visit (INDEPENDENT_AMBULATORY_CARE_PROVIDER_SITE_OTHER): Payer: 59 | Admitting: Internal Medicine

## 2020-07-21 VITALS — HR 77 | Temp 98.5°F

## 2020-07-21 DIAGNOSIS — R059 Cough, unspecified: Secondary | ICD-10-CM

## 2020-07-21 DIAGNOSIS — Z20822 Contact with and (suspected) exposure to covid-19: Secondary | ICD-10-CM

## 2020-07-21 MED ORDER — DOXYCYCLINE HYCLATE 100 MG PO TABS: 100.0000 mg | ORAL_TABLET | Freq: Two times a day (BID) | ORAL | 0 refills | Status: DC

## 2020-07-21 NOTE — Progress Notes (Signed)
   Subjective:    Patient ID: Benjamin Lopez, male    DOB: January 10, 1966, 54 y.o.   MRN: [id number]  HPI 54 year old Male with recent Covid exposure to daughter in grade school who tested positive for Covid-19 and had respiratory symptoms. As I understand, this was a couple of weeks ago.  She has recovered. Older daughter, who is Ship broker at [employer], had stuffy nose and congested but tested negative several times through [facility]. Patient is a Therapist, sports at [employer]. He has scratchy throat, cough and congestion, however, has had 3 negative Covid tests. 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 He was scheduled for CPE today but we cancelled it when he called to say he had respiratory symptoms.  Patient has a history of diabetes mellitus and hyperlipidemia as well as hypertension.  Review of Systems see above-no nausea, vomiting dysgeusia, fever or chills.  No headache.     Objective:   Physical Exam  Pharynx is slightly injected.  Covid PCR test done by nasal swab.  His chest is clear to auscultation. Temperature 98.5 degrees orally pulse 77 regular pulse oximetry 97%.  Respiratory virus panel obtained via nasal swab.     Assessment & Plan:  COVID-19 exposure  Acute respiratory infection symptoms rule out COVID-19  Plan: COVID-19 PCR test obtained and pending.  Doxycycline 100 mg twice daily for 10 days for respiratory infection symptoms.  Respiratory virus panel obtained as well.  Rest at home and drink plenty of fluids.

## 2020-07-21 NOTE — Patient Instructions (Signed)
Respiratory virus panel and COVID-19 PCR test obtained today via nasal swabs.  Rest at home and drink plenty of fluids.  Take doxycycline 100 mg twice daily for 10 days.  We will notify you of these results as soon as we receive them.

## 2020-07-22 LAB — RESPIRATORY VIRUS PANEL
Adenovirus B: NOT DETECTED
HUMAN PARAINFLU VIRUS 1: NOT DETECTED
HUMAN PARAINFLU VIRUS 2: NOT DETECTED
HUMAN PARAINFLU VIRUS 3: NOT DETECTED
INFLUENZA A SUBTYPE H1: NOT DETECTED
INFLUENZA A SUBTYPE H3: NOT DETECTED
Influenza A: NOT DETECTED
Influenza B: NOT DETECTED
Metapneumovirus: NOT DETECTED
Respiratory Syncytial Virus A: NOT DETECTED
Respiratory Syncytial Virus B: NOT DETECTED
Rhinovirus: DETECTED — AB

## 2020-07-22 LAB — SARS-COV-2 RNA,(COVID-19) QUALITATIVE NAAT: SARS CoV2 RNA: NOT DETECTED

## 2020-08-07 MED FILL — OLMESARTAN-HCTZ 40-25 MG TA: 40-25 | 30 days supply | Qty: 30 | Fill #5

## 2020-09-21 ENCOUNTER — Other Ambulatory Visit: Payer: Self-pay | Admitting: Internal Medicine

## 2020-09-21 MED FILL — METFORMIN HCL 500 MG TABS: 500 | 90 days supply | Qty: 180 | Fill #0

## 2020-09-21 MED FILL — AMLODIPINE BESYLATE 5 MG TA: 5 | 90 days supply | Qty: 90 | Fill #0

## 2020-09-21 MED FILL — SYNTHROID 112 MCG TABLET: 112 | 90 days supply | Qty: 90 | Fill #0

## 2020-09-21 MED FILL — ROSUVASTATIN CALCIUM 5 MG T: 5 | 90 days supply | Qty: 90 | Fill #0

## 2020-09-21 MED FILL — OLMESARTAN-HCTZ 40-25 MG TA: 40-25 | 30 days supply | Qty: 30 | Fill #0

## 2020-09-21 MED FILL — FENOFIBRATE 145 MG TABS: 145 | 90 days supply | Qty: 90 | Fill #0

## 2020-12-22 ENCOUNTER — Other Ambulatory Visit (HOSPITAL_BASED_OUTPATIENT_CLINIC_OR_DEPARTMENT_OTHER): Payer: Self-pay

## 2020-12-22 ENCOUNTER — Encounter: Payer: Self-pay | Admitting: Cardiovascular Disease

## 2020-12-30 ENCOUNTER — Other Ambulatory Visit: Payer: Self-pay | Admitting: Internal Medicine

## 2020-12-30 MED FILL — AMLODIPINE BESYLATE 5 MG TA: 5 | 90 days supply | Qty: 90 | Fill #1

## 2020-12-30 MED FILL — SYNTHROID 112 MCG TABLET: 112 | 90 days supply | Qty: 90 | Fill #0

## 2020-12-30 MED FILL — OLMESARTAN-HCTZ 40-25 MG TA: 40-25 | 30 days supply | Qty: 30 | Fill #1

## 2021-02-16 ENCOUNTER — Other Ambulatory Visit: Payer: Self-pay | Admitting: Internal Medicine

## 2021-02-16 ENCOUNTER — Other Ambulatory Visit (HOSPITAL_COMMUNITY): Payer: Self-pay

## 2021-02-16 MED ORDER — OLMESARTAN MEDOXOMIL-HCTZ 40-25 MG PO TABS
1.0000 | ORAL_TABLET | Freq: Every day | ORAL | 1 refills | Status: DC
  Filled 2021-02-16: qty 90, 90d supply, fill #0
  Filled 2021-08-03: qty 90, 90d supply, fill #1

## 2021-02-16 MED ORDER — ROSUVASTATIN CALCIUM 5 MG PO TABS
ORAL_TABLET | Freq: Every day | ORAL | 1 refills | Status: DC
  Filled 2021-02-16: qty 90, 90d supply, fill #0

## 2021-02-16 MED ORDER — METFORMIN HCL 500 MG PO TABS
ORAL_TABLET | Freq: Two times a day (BID) | ORAL | 1 refills | Status: DC
  Filled 2021-02-16: qty 180, 90d supply, fill #0
  Filled 2021-08-03: qty 180, 90d supply, fill #1

## 2021-04-29 ENCOUNTER — Other Ambulatory Visit (HOSPITAL_COMMUNITY): Payer: Self-pay

## 2021-04-29 ENCOUNTER — Other Ambulatory Visit: Payer: Self-pay | Admitting: Internal Medicine

## 2021-04-29 MED ORDER — AMLODIPINE BESYLATE 5 MG PO TABS
ORAL_TABLET | Freq: Every day | ORAL | 1 refills | Status: DC
  Filled 2021-04-29: qty 90, 90d supply, fill #0
  Filled 2021-08-03: qty 90, 90d supply, fill #1

## 2021-04-29 MED ORDER — SYNTHROID 112 MCG PO TABS
112.0000 ug | ORAL_TABLET | Freq: Every day | ORAL | 0 refills | Status: DC
  Filled 2021-04-29: qty 90, 90d supply, fill #0

## 2021-06-08 ENCOUNTER — Other Ambulatory Visit: Payer: 59 | Admitting: Internal Medicine

## 2021-06-08 ENCOUNTER — Other Ambulatory Visit: Payer: Self-pay

## 2021-06-08 DIAGNOSIS — E8881 Metabolic syndrome: Secondary | ICD-10-CM

## 2021-06-08 DIAGNOSIS — I10 Essential (primary) hypertension: Secondary | ICD-10-CM

## 2021-06-08 DIAGNOSIS — Z0000 Encounter for general adult medical examination without abnormal findings: Secondary | ICD-10-CM

## 2021-06-08 DIAGNOSIS — E782 Mixed hyperlipidemia: Secondary | ICD-10-CM

## 2021-06-08 DIAGNOSIS — R7302 Impaired glucose tolerance (oral): Secondary | ICD-10-CM

## 2021-06-08 DIAGNOSIS — E039 Hypothyroidism, unspecified: Secondary | ICD-10-CM | POA: Diagnosis not present

## 2021-06-09 LAB — COMPLETE METABOLIC PANEL WITH GFR
AG Ratio: 2 (calc) (ref 1.0–2.5)
ALT: 65 U/L — ABNORMAL HIGH (ref 9–46)
AST: 49 U/L — ABNORMAL HIGH (ref 10–35)
Albumin: 4.9 g/dL (ref 3.6–5.1)
Alkaline phosphatase (APISO): 52 U/L (ref 35–144)
BUN: 17 mg/dL (ref 7–25)
CO2: 29 mmol/L (ref 20–32)
Calcium: 10.1 mg/dL (ref 8.6–10.3)
Chloride: 103 mmol/L (ref 98–110)
Creat: 1.13 mg/dL (ref 0.70–1.30)
Globulin: 2.5 g/dL (calc) (ref 1.9–3.7)
Glucose, Bld: 114 mg/dL — ABNORMAL HIGH (ref 65–99)
Potassium: 4.1 mmol/L (ref 3.5–5.3)
Sodium: 140 mmol/L (ref 135–146)
Total Bilirubin: 0.5 mg/dL (ref 0.2–1.2)
Total Protein: 7.4 g/dL (ref 6.1–8.1)
eGFR: 78 mL/min/{1.73_m2} (ref >=60)

## 2021-06-09 LAB — CBC WITH DIFFERENTIAL/PLATELET
Absolute Monocytes: 298 cells/uL (ref 200–950)
Basophils Absolute: 21 cells/uL (ref 0–200)
Basophils Relative: 0.5 %
Eosinophils Absolute: 59 cells/uL (ref 15–500)
Eosinophils Relative: 1.4 %
HCT: 46 % (ref 38.5–50.0)
Hemoglobin: 15.5 g/dL (ref 13.2–17.1)
Lymphs Abs: 1924 cells/uL (ref 850–3900)
MCH: 27.3 pg (ref 27.0–33.0)
MCHC: 33.7 g/dL (ref 32.0–36.0)
MCV: 81 fL (ref 80.0–100.0)
MPV: 10.5 fL (ref 7.5–12.5)
Monocytes Relative: 7.1 %
Neutro Abs: 1898 cells/uL (ref 1500–7800)
Neutrophils Relative %: 45.2 %
Platelets: 268 10*3/uL (ref 140–400)
RBC: 5.68 10*6/uL (ref 4.20–5.80)
RDW: 12.4 % (ref 11.0–15.0)
Total Lymphocyte: 45.8 %
WBC: 4.2 10*3/uL (ref 3.8–10.8)

## 2021-06-09 LAB — HEMOGLOBIN A1C
Hgb A1c MFr Bld: 6.4 % of total Hgb — ABNORMAL HIGH (ref <5.7)
Mean Plasma Glucose: 137 mg/dL
eAG (mmol/L): 7.6 mmol/L

## 2021-06-09 LAB — PSA: PSA: 0.66 ng/mL (ref <=4.00)

## 2021-06-09 LAB — MICROALBUMIN / CREATININE URINE RATIO
Creatinine, Urine: 25 mg/dL (ref 20–320)
Microalb Creat Ratio: 8 mcg/mg creat (ref <30)
Microalb, Ur: 0.2 mg/dL

## 2021-06-09 LAB — LIPID PANEL
Cholesterol: 167 mg/dL (ref <200)
HDL: 34 mg/dL — ABNORMAL LOW (ref >=40)
LDL Cholesterol (Calc): 100 mg/dL (calc) — ABNORMAL HIGH
Non-HDL Cholesterol (Calc): 133 mg/dL (calc) — ABNORMAL HIGH (ref <130)
Total CHOL/HDL Ratio: 4.9 (calc) (ref <5.0)
Triglycerides: 211 mg/dL — ABNORMAL HIGH (ref <150)

## 2021-06-10 ENCOUNTER — Other Ambulatory Visit: Payer: Self-pay

## 2021-06-10 ENCOUNTER — Encounter: Payer: Self-pay | Admitting: Internal Medicine

## 2021-06-10 ENCOUNTER — Other Ambulatory Visit (HOSPITAL_COMMUNITY): Payer: Self-pay

## 2021-06-10 ENCOUNTER — Ambulatory Visit (INDEPENDENT_AMBULATORY_CARE_PROVIDER_SITE_OTHER): Payer: 59 | Admitting: Internal Medicine

## 2021-06-10 VITALS — BP 132/82 | HR 82 | Ht 71.0 in | Wt 224.0 lb

## 2021-06-10 DIAGNOSIS — R7302 Impaired glucose tolerance (oral): Secondary | ICD-10-CM | POA: Diagnosis not present

## 2021-06-10 DIAGNOSIS — E8881 Metabolic syndrome: Secondary | ICD-10-CM

## 2021-06-10 DIAGNOSIS — E786 Lipoprotein deficiency: Secondary | ICD-10-CM

## 2021-06-10 DIAGNOSIS — E039 Hypothyroidism, unspecified: Secondary | ICD-10-CM

## 2021-06-10 DIAGNOSIS — B009 Herpesviral infection, unspecified: Secondary | ICD-10-CM

## 2021-06-10 DIAGNOSIS — Z6831 Body mass index (BMI) 31.0-31.9, adult: Secondary | ICD-10-CM

## 2021-06-10 DIAGNOSIS — Z800 Family history of malignant neoplasm of digestive organs: Secondary | ICD-10-CM

## 2021-06-10 DIAGNOSIS — E782 Mixed hyperlipidemia: Secondary | ICD-10-CM

## 2021-06-10 DIAGNOSIS — I10 Essential (primary) hypertension: Secondary | ICD-10-CM | POA: Diagnosis not present

## 2021-06-10 DIAGNOSIS — Z0000 Encounter for general adult medical examination without abnormal findings: Secondary | ICD-10-CM | POA: Diagnosis not present

## 2021-06-10 MED ORDER — ROSUVASTATIN CALCIUM 10 MG PO TABS
10.0000 mg | ORAL_TABLET | Freq: Every day | ORAL | 3 refills | Status: DC
  Filled 2021-06-10: qty 90, 90d supply, fill #0

## 2021-06-10 NOTE — Patient Instructions (Addendum)
Patient did have lipid panel liver functions along with hemoglobin A1C  in 3 months.  Triglycerides are significantly elevated.  Increase Crestor to 10 mg daily.  Follow-up in 3 months.  LDL is borderline at 100.

## 2021-07-28 NOTE — Progress Notes (Signed)
Subjective:    Patient ID: Benjamin Lopez , male    DOB: November 04, 1965, 55 y.o.    MRN: [id number]   55 y.o. Male presents today for health maintenance exam and evaluation of medical issues.  He has a history of hypertension, hypothyroidism, hyperlipidemia and type 2 diabetes mellitus.  He had colonoscopy in 2019.  He had 1 tubular adenoma.  No known drug allergies.  Past medical history: Fractured right clavicle 1991, fractured right tibia in 1983, mononucleosis at age 32.  History of poststreptococcal glomerulonephritis in 1986.  Fracture right elbow 1998.  Bilateral hydrocele repair 1971.  History of duplication of the left collecting system.  Social history: He is married.  He has 2 daughters.  He does not smoke.  Social alcohol consumption.  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  Family history: Mother with history of coronary artery disease, impaired glucose tolerance, colon cancer, hypertension, COPD and hyperlipidemia as well as osteoarthritis.  Father's family history unknown.  Father's history is also unknown.  He is an only child.  Maternal aunt died from complications of COPD but has history of smoking, diabetes, breast cancer and hyperlipidemia.  He gets flu vaccine through employment.    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      Objective:   Vitals:   06/10/21 1457  BP: 132/82  Pulse: 82  SpO2: 98%     Physical Exam Constitutional:      Appearance: Normal appearance.  HENT:     Head: Normocephalic and atraumatic.     Right Ear: Tympanic membrane normal.     Left Ear: Tympanic membrane normal.     Mouth/Throat:     Pharynx: Oropharynx is clear.  Eyes:     Extraocular Movements: Extraocular movements intact.     Conjunctiva/sclera: Conjunctivae normal.  Neck:     Vascular: No carotid bruit.  Cardiovascular:     Heart sounds: No murmur heard. Pulmonary:     Effort:  Pulmonary effort is normal.     Breath sounds: Normal breath sounds. No rales.  Abdominal:     General: Bowel sounds are normal.     Palpations: Abdomen is soft.     Tenderness: There is no abdominal tenderness. There is no guarding or rebound.  Genitourinary:    Prostate: Normal.  Musculoskeletal:     Cervical back: Neck supple.     Right lower leg: No edema.     Left lower leg: No edema.  Lymphadenopathy:     Cervical: No cervical adenopathy.  Neurological:     General: No focal deficit present.     Mental Status: He is alert and oriented to person, place, and time.     Cranial Nerves: No cranial nerve deficit.     Coordination: Coordination normal.  Psychiatric:        Mood and Affect: Mood normal.        Behavior: Behavior normal.        Thought Content: Thought content normal.         Assessment & Plan:  Essential hypertension blood pressure stable on current regimen of amlodipine and olmesartan HCTZ  Impaired glucose tolerance-hemoglobin A1c 6.4% was 6.3% in April 2021.  Was 6.2% in 2018.  He is on metformin 500 mg twice daily.  Consideration could be given to adding another medication but he does not want to do that at this time.  Reevaluate in 6 months.  Needs to lose weight and exercise.  Hyperlipidemia treated with Crestor 5 mg daily and triglycerides are significantly elevated at 211 and LDL cholesterol was 100.  Increase Crestor to 10 mg daily and follow-up in 3 months.  Hypothyroidism-stable on Synthroid 0.112 mg daily.  TSH was overlooked with this visit.  TSH in April 2021 was 2.25.  Follow-up at next visit in 6 months.  History of Herpes simplex treated with as needed Valtrex.  Health maintenance had screening colonoscopy in 2019 with follow-up recommended in 5 years.  Flu vaccine done through employment.  Had COVID booster April 19, 2021.  Patient needs to check with employee health about tetanus update.  Plan: Discussion regarding diet exercise and weight  loss.    Increase Crestor to 10 mg daily.  Follow-up in 3 months    Elby Showers, MD

## 2021-08-03 ENCOUNTER — Other Ambulatory Visit (HOSPITAL_COMMUNITY): Payer: Self-pay

## 2021-08-03 ENCOUNTER — Other Ambulatory Visit: Payer: Self-pay | Admitting: Internal Medicine

## 2021-08-03 MED ORDER — FENOFIBRATE 145 MG PO TABS
ORAL_TABLET | Freq: Every day | ORAL | 3 refills | Status: DC
  Filled 2021-08-03: qty 90, 90d supply, fill #0
  Filled 2021-11-17: qty 90, 90d supply, fill #1
  Filled 2022-03-02: qty 90, 90d supply, fill #2
  Filled 2022-05-09: qty 27, 27d supply, fill #3
  Filled 2022-05-13: qty 63, 63d supply, fill #3

## 2021-08-03 MED ORDER — SYNTHROID 112 MCG PO TABS
112.0000 ug | ORAL_TABLET | Freq: Every day | ORAL | 1 refills | Status: DC
Start: 2021-08-03 — End: 2022-03-02
  Filled 2021-08-03: qty 90, 90d supply, fill #0
  Filled 2021-11-17: qty 90, 90d supply, fill #1

## 2021-08-17 ENCOUNTER — Other Ambulatory Visit (HOSPITAL_COMMUNITY): Payer: Self-pay

## 2021-08-17 MED ORDER — AZITHROMYCIN 500 MG PO TABS
ORAL_TABLET | ORAL | 0 refills | Status: DC
  Filled 2021-08-17: qty 3, 3d supply, fill #0

## 2021-09-09 ENCOUNTER — Other Ambulatory Visit: Payer: Self-pay

## 2021-09-09 ENCOUNTER — Other Ambulatory Visit: Payer: 59 | Admitting: Internal Medicine

## 2021-09-09 DIAGNOSIS — R7302 Impaired glucose tolerance (oral): Secondary | ICD-10-CM

## 2021-09-09 DIAGNOSIS — E782 Mixed hyperlipidemia: Secondary | ICD-10-CM

## 2021-09-10 LAB — LIPID PANEL
Cholesterol: 188 mg/dL (ref <200)
HDL: 48 mg/dL (ref >=40)
LDL Cholesterol (Calc): 115 mg/dL (calc) — ABNORMAL HIGH
Non-HDL Cholesterol (Calc): 140 mg/dL (calc) — ABNORMAL HIGH (ref <130)
Total CHOL/HDL Ratio: 3.9 (calc) (ref <5.0)
Triglycerides: 131 mg/dL (ref <150)

## 2021-09-10 LAB — HEMOGLOBIN A1C
Hgb A1c MFr Bld: 6 % of total Hgb — ABNORMAL HIGH (ref <5.7)
Mean Plasma Glucose: 126 mg/dL
eAG (mmol/L): 7 mmol/L

## 2021-09-14 ENCOUNTER — Encounter: Payer: Self-pay | Admitting: Internal Medicine

## 2021-09-14 DIAGNOSIS — H524 Presbyopia: Secondary | ICD-10-CM | POA: Diagnosis not present

## 2021-09-14 DIAGNOSIS — H52203 Unspecified astigmatism, bilateral: Secondary | ICD-10-CM | POA: Diagnosis not present

## 2021-09-14 DIAGNOSIS — H5213 Myopia, bilateral: Secondary | ICD-10-CM | POA: Diagnosis not present

## 2021-09-14 LAB — HM DIABETES EYE EXAM

## 2021-09-16 ENCOUNTER — Encounter: Payer: Self-pay | Admitting: Internal Medicine

## 2021-09-16 ENCOUNTER — Other Ambulatory Visit (HOSPITAL_COMMUNITY): Payer: Self-pay

## 2021-09-16 ENCOUNTER — Ambulatory Visit: Payer: 59 | Admitting: Internal Medicine

## 2021-09-16 ENCOUNTER — Other Ambulatory Visit: Payer: Self-pay

## 2021-09-16 VITALS — BP 128/72 | HR 74 | Temp 97.8°F | Ht 71.0 in | Wt 210.0 lb

## 2021-09-16 DIAGNOSIS — Z800 Family history of malignant neoplasm of digestive organs: Secondary | ICD-10-CM | POA: Diagnosis not present

## 2021-09-16 DIAGNOSIS — Z6829 Body mass index (BMI) 29.0-29.9, adult: Secondary | ICD-10-CM | POA: Diagnosis not present

## 2021-09-16 DIAGNOSIS — E039 Hypothyroidism, unspecified: Secondary | ICD-10-CM | POA: Diagnosis not present

## 2021-09-16 DIAGNOSIS — I10 Essential (primary) hypertension: Secondary | ICD-10-CM | POA: Diagnosis not present

## 2021-09-16 DIAGNOSIS — E8881 Metabolic syndrome: Secondary | ICD-10-CM

## 2021-09-16 DIAGNOSIS — E782 Mixed hyperlipidemia: Secondary | ICD-10-CM

## 2021-09-16 DIAGNOSIS — R7302 Impaired glucose tolerance (oral): Secondary | ICD-10-CM | POA: Diagnosis not present

## 2021-09-16 MED ORDER — ROSUVASTATIN CALCIUM 20 MG PO TABS
20.0000 mg | ORAL_TABLET | Freq: Every day | ORAL | 3 refills | Status: DC
  Filled 2021-09-16: qty 90, 90d supply, fill #0
  Filled 2022-03-02: qty 90, 90d supply, fill #1
  Filled 2022-05-09: qty 90, 90d supply, fill #2

## 2021-09-16 NOTE — Progress Notes (Signed)
° °  Subjective:    Patient ID: Benjamin Lopez, male    DOB: Oct 28, 1965, 55 y.o.   MRN: [id number]  HPI Here for 6 month recheck. Has lost 14 pounds since September.  He has a history of hypertension, hypothyroidism, hyperlipidemia and type 2 diabetes mellitus.  Hgb AIC is excellent at 6%.  Had colonoscopy in 2019 with 1 tubular adenoma.  TSH was checked in April 2021 and was 2.25.  It was overlooked with this visit but will be rechecked with next visit.  Lipid panel has improved since September.  At that time his triglycerides were 211 and are now 131.  His HDL has improved from 34 to 48.  His LDL is up slightly from 100 to 115.  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  Blood pressure is excellent on current regimen at 128/72  Review of Systems no new complaints or issues.     Objective:   Physical Exam Blood pressure 128/72 pulse 74 regular temperature 97.8 degrees pulse oximetry 99% weight 210 pounds BMI 29.29  Skin is warm and dry.  Neck is supple without JVD, thyromegaly, carotid bruits.  No thyromegaly.  Chest clear to auscultation.  Cardiac exam: Regular rate and rhythm without ectopy or murmur.  No lower extremity pitting edema.     Assessment & Plan:   Hypertension-stable on Benicar HCT and amlodipine 5 mg daily.  Potassium, BUN and creatinine were stable in September.  Type 2 diabetes mellitus treated with metformin 500 mg twice daily and stable  Hyperlipidemia treated with generic Crestor 20 mg daily  Hypothyroidism treated with Synthroid 0.112 mg daily-overlooked drawing TSH with this visit  Family history of colon cancer in mother.  He had colonoscopy in 2019 with repeat study in 5 years.  Plan: He feels well and we will continue with current medications as prescribed.  His physical exam has been booked for September 2023.  He will continue to exercise and watch his diet.  I am pleased with his weight loss of 14 pounds.

## 2021-10-08 ENCOUNTER — Telehealth: Payer: 59 | Admitting: Internal Medicine

## 2021-10-08 ENCOUNTER — Other Ambulatory Visit: Payer: Self-pay

## 2021-10-08 ENCOUNTER — Encounter: Payer: Self-pay | Admitting: Internal Medicine

## 2021-10-08 VITALS — Temp 98.3°F

## 2021-10-08 DIAGNOSIS — U071 COVID-19: Secondary | ICD-10-CM

## 2021-10-08 MED ORDER — NIRMATRELVIR/RITONAVIR (PAXLOVID)TABLET: 3.0000 | ORAL_TABLET | Freq: Two times a day (BID) | ORAL | 0 refills | Status: AC

## 2021-10-08 NOTE — Progress Notes (Signed)
° °  Subjective:    Patient ID: Benjamin Lopez, male    DOB: [birth year], 56 y.o.   MRN: [id number]  HPI 56 year old Male seen today via interactive audio and video telecommunications due to coronavirus pandemic.  He is identified using 2 identifiers as Benjamin Lopez, a patient in this practice.  He is at his home and I am at my office.  He is agreeable to visit in this format today.  Patient is an ICU Nurse at [employer].  He has tested positive for COVID-19 today.  He has fatigue and malaise but no significant cough at the present time.  Apparently was exposed through his daughter who has been to a holiday party.  Patient had received a booster for COVID-19 in July 2022.  He had flu vaccine in October 2022.  He will contact health at work about his work status.  He generally works weekend option.  He will not be able to go to work this evening.  He will need to be out of work for 5 days minimum.  We discussed various treatments including Paxlovid, symptomatic treatment or Zithromax.  He has elected to try pack Slo-Bid.  He has normal renal functions.  Patient has a history of essential hypertension, impaired glucose tolerance, hypothyroidism and hyperlipidemia.  He is a non-smoker.  He is physically active and does hiking.  Stays in good physical condition.  He was recently seen here in December for 4-month recheck and hemoglobin A1c was 6%.  Lipids controlled with statin medication.  He is on Synthroid 0.112 mg daily for hypothyroidism and amlodipine and Benicar HCT for hypertension.    Review of Systems see above no nausea vomiting or diarrhea at the present time     Objective:   Physical Exam  He is seen virtually in no acute distress but looks fatigued.  Does not appear to be tachypneic.  Not heard to be coughing at the present time.      Assessment & Plan:   Acute COVID-19 virus infection  Essential hypertension stable with current regimen  Type 2 diabetes mellitus-stable on  recent check in December  Hypothyroidism stable with thyroid replacement  Plan: Paxlovid regular strength (he has normal renal functions) prescription sent to [facility] in [place].   Rest and drink plenty of fluids.  Use pulse oximetry to check oxygen level.  Walk some to prevent atelectasis.  Call if symptoms not improving or worsen.  Time spent is 20 minutes with this visit

## 2021-10-08 NOTE — Patient Instructions (Addendum)
Paxlovid prescription has been sent to [place].  Take as directed.  Stay well-hydrated, monitor pulse oximetry and walk some to prevent atelectasis.  Call if symptoms not improving or worsening.  Notify Health at Work please.  Likely will be out of work for 5 days.

## 2021-10-18 ENCOUNTER — Telehealth: Payer: Self-pay

## 2021-10-18 ENCOUNTER — Telehealth (INDEPENDENT_AMBULATORY_CARE_PROVIDER_SITE_OTHER): Payer: 59 | Admitting: Internal Medicine

## 2021-10-18 ENCOUNTER — Encounter: Payer: Self-pay | Admitting: Internal Medicine

## 2021-10-18 ENCOUNTER — Other Ambulatory Visit: Payer: Self-pay

## 2021-10-18 DIAGNOSIS — U071 COVID-19: Secondary | ICD-10-CM

## 2021-10-18 DIAGNOSIS — R7302 Impaired glucose tolerance (oral): Secondary | ICD-10-CM

## 2021-10-18 DIAGNOSIS — E782 Mixed hyperlipidemia: Secondary | ICD-10-CM | POA: Diagnosis not present

## 2021-10-18 DIAGNOSIS — E8881 Metabolic syndrome: Secondary | ICD-10-CM

## 2021-10-18 DIAGNOSIS — R5383 Other fatigue: Secondary | ICD-10-CM | POA: Diagnosis not present

## 2021-10-18 DIAGNOSIS — I10 Essential (primary) hypertension: Secondary | ICD-10-CM | POA: Diagnosis not present

## 2021-10-18 DIAGNOSIS — R5381 Other malaise: Secondary | ICD-10-CM | POA: Diagnosis not present

## 2021-10-18 DIAGNOSIS — E039 Hypothyroidism, unspecified: Secondary | ICD-10-CM

## 2021-10-18 MED ORDER — AZITHROMYCIN 250 MG PO TABS: ORAL_TABLET | ORAL | 0 refills | Status: AC

## 2021-10-18 MED ORDER — PREDNISONE 10 MG PO TABS: ORAL_TABLET | ORAL | 0 refills | Status: DC

## 2021-10-18 NOTE — Patient Instructions (Addendum)
Discussed protracted symptoms of Covid-19 including fatigue and malaise. Will try tapering course of prednisone for respiratory congestion and Zithromax Z pak. Rest and stay well hydrated. Walk some for exercise. Monitor pulse oximetry.

## 2021-10-18 NOTE — Telephone Encounter (Signed)
Patient states that he was feeling better after the paxlovid was cleared to work on Sunday . After work he started feeling bad and now is back to his original symptoms of temp, cough, sore throat, congestion, feels worse than the first time. Thinks it rebound. Asking if you think a steroid would help or if he needs another appt.

## 2021-10-18 NOTE — Progress Notes (Signed)
° °  Subjective:    Patient ID: Benjamin Lopez, male    DOB: [birth year], 56 y.o.   MRN: [id number]  HPI 56 year old Male with history of Covid-19 who tested positive for COVID-19 on January 6 and was treated with pack Slo-Bid after virtual visit with me called complaining of fatigue, malaise, temp, cough sore throat and respiratory congestion.  He returned to work this past weekend.  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  He was able to complete his shift.  He went home after shift was over early this morning and says he is still testing positive for COVID-19.  Has considerable fatigue and malaise.  Due to the Coronavirus pandemic, he is seen by interactive audio and video telecommunications.  He requested virtual visit.  He is identified using 2 identifiers as Benjamin Lopez, a patient in this practice.  He is at his home and I am at my office.  He is agreeable to visit in this format today.  He has a history of hypothyroidism treated with thyroid replacement, hypertension, hyperlipidemia and type 2 diabetes mellitus.    Review of Systems see above he denies nausea vomiting or diarrhea     Objective:   Physical Exam  VS were not provided by the patient today.  He looks a bit fatigued.  He is not tachypneic and is able to give a clear concise history.       Assessment & Plan:  Protracted COVID-19 symptoms after taking Paxlovid-I do not know that the truly has rebound COVID or perhaps it is just taking some time to recover from COVID-19 virus infection.  I think he can get expect his energy level to be low up to a month from time of onset of his illness.  He does not have to return to work until next weekend.  Plan: He will take prednisone 10 mg tablets (#21) to start 6 tablets day 1 and decrease by 1 tablet daily i.e. 6-5-4-3-2-1 taper.  He will take Zithromax Z-PAK 2 tabs day 1 followed by 1 tab days 2 through 5 for protracted cough.  He will monitor pulse oximetry  and try to walk some.  He will monitor Accu-Cheks.  He will call if symptoms not improving in a few days or sooner if worse.

## 2021-10-18 NOTE — Telephone Encounter (Signed)
Scheduled

## 2021-10-20 NOTE — Patient Instructions (Signed)
It was a pleasure to see you today.  I am pleased with your weight loss.  Continue diet and exercise efforts.  Continue current medications.  Health maintenance exam and fasting labs have been scheduled for September 2023.

## 2021-11-17 ENCOUNTER — Other Ambulatory Visit (HOSPITAL_COMMUNITY): Payer: Self-pay

## 2021-11-17 ENCOUNTER — Other Ambulatory Visit: Payer: Self-pay | Admitting: Internal Medicine

## 2021-11-17 MED ORDER — OLMESARTAN MEDOXOMIL-HCTZ 40-25 MG PO TABS
1.0000 | ORAL_TABLET | Freq: Every day | ORAL | 1 refills | Status: DC
  Filled 2021-11-17: qty 90, 90d supply, fill #0
  Filled 2022-03-02: qty 90, 90d supply, fill #1

## 2021-11-17 MED ORDER — METFORMIN HCL 500 MG PO TABS
ORAL_TABLET | Freq: Two times a day (BID) | ORAL | 1 refills | Status: DC
  Filled 2021-11-17: qty 180, 90d supply, fill #0
  Filled 2022-03-02: qty 180, 90d supply, fill #1

## 2021-12-01 ENCOUNTER — Other Ambulatory Visit (HOSPITAL_COMMUNITY): Payer: Self-pay

## 2021-12-01 ENCOUNTER — Other Ambulatory Visit: Payer: Self-pay | Admitting: Internal Medicine

## 2021-12-01 MED ORDER — AZITHROMYCIN 250 MG PO TABS
ORAL_TABLET | ORAL | 0 refills | Status: DC
  Filled 2021-12-01: qty 2, 1d supply, fill #0

## 2021-12-02 ENCOUNTER — Other Ambulatory Visit (HOSPITAL_COMMUNITY): Payer: Self-pay

## 2021-12-02 MED ORDER — AMLODIPINE BESYLATE 5 MG PO TABS
ORAL_TABLET | Freq: Every day | ORAL | 1 refills | Status: DC
  Filled 2021-12-02: qty 90, 90d supply, fill #0
  Filled 2022-03-02 (×2): qty 90, 90d supply, fill #1

## 2022-03-02 ENCOUNTER — Other Ambulatory Visit: Payer: Self-pay | Admitting: Internal Medicine

## 2022-03-02 ENCOUNTER — Other Ambulatory Visit (HOSPITAL_COMMUNITY): Payer: Self-pay

## 2022-03-02 MED ORDER — SYNTHROID 112 MCG PO TABS
112.0000 ug | ORAL_TABLET | Freq: Every day | ORAL | 1 refills | Status: DC
  Filled 2022-03-02: qty 90, 90d supply, fill #0
  Filled 2022-05-09: qty 90, 90d supply, fill #1

## 2022-05-09 ENCOUNTER — Other Ambulatory Visit: Payer: Self-pay | Admitting: Internal Medicine

## 2022-05-09 ENCOUNTER — Other Ambulatory Visit (HOSPITAL_COMMUNITY): Payer: Self-pay

## 2022-05-09 ENCOUNTER — Telehealth: Payer: Self-pay | Admitting: Internal Medicine

## 2022-05-09 MED ORDER — VALACYCLOVIR HCL 500 MG PO TABS
500.0000 mg | ORAL_TABLET | Freq: Two times a day (BID) | ORAL | 1 refills | Status: DC
  Filled 2022-05-09: qty 10, 5d supply, fill #0
  Filled 2023-01-09: qty 10, 5d supply, fill #1

## 2022-05-09 NOTE — Telephone Encounter (Signed)
Benjamin Lopez [phone]  Cardarius has a fever blister and would like to get a prescription for below medication. Do you want to do a video visit? He has not had refill since 02/2019.  valACYclovir (VALTREX) 500 MG tablet  [facility] Phone:  [phone]  Fax:  [phone]

## 2022-05-09 NOTE — Telephone Encounter (Signed)
I have refilled this once with one refill. He needs to address this at his CPE. MJB, MD

## 2022-05-10 ENCOUNTER — Other Ambulatory Visit (HOSPITAL_COMMUNITY): Payer: Self-pay

## 2022-05-10 MED ORDER — AMLODIPINE BESYLATE 5 MG PO TABS
ORAL_TABLET | Freq: Every day | ORAL | 1 refills | Status: DC
  Filled 2022-05-10 – 2022-06-14 (×2): qty 90, 90d supply, fill #0
  Filled 2022-10-24: qty 90, 90d supply, fill #1

## 2022-05-10 MED ORDER — METFORMIN HCL 500 MG PO TABS
500.0000 mg | ORAL_TABLET | Freq: Two times a day (BID) | ORAL | 1 refills | Status: DC
  Filled 2022-05-10 – 2023-01-09 (×2): qty 180, 90d supply, fill #0

## 2022-05-10 MED ORDER — OLMESARTAN MEDOXOMIL-HCTZ 40-25 MG PO TABS
1.0000 | ORAL_TABLET | Freq: Every day | ORAL | 1 refills | Status: DC
  Filled 2022-05-10 – 2022-09-12 (×2): qty 90, 90d supply, fill #0
  Filled 2023-01-09: qty 90, 90d supply, fill #1

## 2022-05-13 ENCOUNTER — Other Ambulatory Visit (HOSPITAL_COMMUNITY): Payer: Self-pay

## 2022-06-09 ENCOUNTER — Other Ambulatory Visit: Payer: 59

## 2022-06-09 DIAGNOSIS — R7302 Impaired glucose tolerance (oral): Secondary | ICD-10-CM | POA: Diagnosis not present

## 2022-06-09 DIAGNOSIS — I10 Essential (primary) hypertension: Secondary | ICD-10-CM

## 2022-06-09 DIAGNOSIS — E782 Mixed hyperlipidemia: Secondary | ICD-10-CM

## 2022-06-09 DIAGNOSIS — E039 Hypothyroidism, unspecified: Secondary | ICD-10-CM | POA: Diagnosis not present

## 2022-06-09 DIAGNOSIS — Z125 Encounter for screening for malignant neoplasm of prostate: Secondary | ICD-10-CM

## 2022-06-09 DIAGNOSIS — E8881 Metabolic syndrome: Secondary | ICD-10-CM

## 2022-06-10 LAB — COMPLETE METABOLIC PANEL WITH GFR
AG Ratio: 2 (calc) (ref 1.0–2.5)
ALT: 40 U/L (ref 9–46)
AST: 23 U/L (ref 10–35)
Albumin: 4.9 g/dL (ref 3.6–5.1)
Alkaline phosphatase (APISO): 49 U/L (ref 35–144)
BUN/Creatinine Ratio: 20 (calc) (ref 6–22)
BUN: 27 mg/dL — ABNORMAL HIGH (ref 7–25)
CO2: 28 mmol/L (ref 20–32)
Calcium: 10.4 mg/dL — ABNORMAL HIGH (ref 8.6–10.3)
Chloride: 103 mmol/L (ref 98–110)
Creat: 1.33 mg/dL — ABNORMAL HIGH (ref 0.70–1.30)
Globulin: 2.4 g/dL (calc) (ref 1.9–3.7)
Glucose, Bld: 99 mg/dL (ref 65–99)
Potassium: 4.2 mmol/L (ref 3.5–5.3)
Sodium: 140 mmol/L (ref 135–146)
Total Bilirubin: 0.5 mg/dL (ref 0.2–1.2)
Total Protein: 7.3 g/dL (ref 6.1–8.1)
eGFR: 64 mL/min/{1.73_m2} (ref >=60)

## 2022-06-10 LAB — CBC WITH DIFFERENTIAL/PLATELET
Absolute Monocytes: 249 cells/uL (ref 200–950)
Basophils Absolute: 21 cells/uL (ref 0–200)
Basophils Relative: 0.6 %
Eosinophils Absolute: 39 cells/uL (ref 15–500)
Eosinophils Relative: 1.1 %
HCT: 45.4 % (ref 38.5–50.0)
Hemoglobin: 14.9 g/dL (ref 13.2–17.1)
Lymphs Abs: 1652 cells/uL (ref 850–3900)
MCH: 26.5 pg — ABNORMAL LOW (ref 27.0–33.0)
MCHC: 32.8 g/dL (ref 32.0–36.0)
MCV: 80.8 fL (ref 80.0–100.0)
MPV: 9.8 fL (ref 7.5–12.5)
Monocytes Relative: 7.1 %
Neutro Abs: 1540 cells/uL (ref 1500–7800)
Neutrophils Relative %: 44 %
Platelets: 270 10*3/uL (ref 140–400)
RBC: 5.62 10*6/uL (ref 4.20–5.80)
RDW: 12.3 % (ref 11.0–15.0)
Total Lymphocyte: 47.2 %
WBC: 3.5 10*3/uL — ABNORMAL LOW (ref 3.8–10.8)

## 2022-06-10 LAB — LIPID PANEL
Cholesterol: 173 mg/dL (ref <200)
HDL: 33 mg/dL — ABNORMAL LOW (ref >=40)
LDL Cholesterol (Calc): 110 mg/dL (calc) — ABNORMAL HIGH
Non-HDL Cholesterol (Calc): 140 mg/dL (calc) — ABNORMAL HIGH (ref <130)
Total CHOL/HDL Ratio: 5.2 (calc) — ABNORMAL HIGH (ref <5.0)
Triglycerides: 183 mg/dL — ABNORMAL HIGH (ref <150)

## 2022-06-10 LAB — HEMOGLOBIN A1C
Hgb A1c MFr Bld: 5.9 % of total Hgb — ABNORMAL HIGH (ref <5.7)
Mean Plasma Glucose: 123 mg/dL
eAG (mmol/L): 6.8 mmol/L

## 2022-06-10 LAB — PSA: PSA: 1.11 ng/mL (ref <=4.00)

## 2022-06-10 LAB — MICROALBUMIN, URINE: Microalb, Ur: <0.2 mg/dL

## 2022-06-10 LAB — TSH: TSH: 4.56 mIU/L — ABNORMAL HIGH (ref 0.40–4.50)

## 2022-06-14 ENCOUNTER — Encounter: Payer: Self-pay | Admitting: Internal Medicine

## 2022-06-14 ENCOUNTER — Ambulatory Visit (INDEPENDENT_AMBULATORY_CARE_PROVIDER_SITE_OTHER): Payer: 59 | Admitting: Internal Medicine

## 2022-06-14 ENCOUNTER — Other Ambulatory Visit (HOSPITAL_COMMUNITY): Payer: Self-pay

## 2022-06-14 VITALS — BP 100/62 | HR 67 | Temp 97.7°F | Ht 71.0 in | Wt 213.8 lb

## 2022-06-14 DIAGNOSIS — R7302 Impaired glucose tolerance (oral): Secondary | ICD-10-CM | POA: Diagnosis not present

## 2022-06-14 DIAGNOSIS — I10 Essential (primary) hypertension: Secondary | ICD-10-CM

## 2022-06-14 DIAGNOSIS — Z800 Family history of malignant neoplasm of digestive organs: Secondary | ICD-10-CM

## 2022-06-14 DIAGNOSIS — Z0000 Encounter for general adult medical examination without abnormal findings: Secondary | ICD-10-CM | POA: Diagnosis not present

## 2022-06-14 DIAGNOSIS — S70361A Insect bite (nonvenomous), right thigh, initial encounter: Secondary | ICD-10-CM

## 2022-06-14 DIAGNOSIS — E782 Mixed hyperlipidemia: Secondary | ICD-10-CM | POA: Diagnosis not present

## 2022-06-14 DIAGNOSIS — E119 Type 2 diabetes mellitus without complications: Secondary | ICD-10-CM

## 2022-06-14 DIAGNOSIS — Z6829 Body mass index (BMI) 29.0-29.9, adult: Secondary | ICD-10-CM

## 2022-06-14 DIAGNOSIS — R7989 Other specified abnormal findings of blood chemistry: Secondary | ICD-10-CM | POA: Diagnosis not present

## 2022-06-14 DIAGNOSIS — W57XXXA Bitten or stung by nonvenomous insect and other nonvenomous arthropods, initial encounter: Secondary | ICD-10-CM

## 2022-06-14 DIAGNOSIS — E039 Hypothyroidism, unspecified: Secondary | ICD-10-CM

## 2022-06-14 DIAGNOSIS — E786 Lipoprotein deficiency: Secondary | ICD-10-CM

## 2022-06-14 LAB — POCT URINALYSIS DIPSTICK
Bilirubin, UA: NEGATIVE
Blood, UA: NEGATIVE
Glucose, UA: NEGATIVE
Ketones, UA: NEGATIVE
Leukocytes, UA: NEGATIVE
Nitrite, UA: NEGATIVE
Protein, UA: NEGATIVE
Spec Grav, UA: 1.01 (ref 1.010–1.025)
Urobilinogen, UA: 0.2 E.U./dL
pH, UA: 7.5 (ref 5.0–8.0)

## 2022-06-14 MED ORDER — DOXYCYCLINE HYCLATE 100 MG PO TABS
100.0000 mg | ORAL_TABLET | Freq: Two times a day (BID) | ORAL | 0 refills | Status: DC
  Filled 2022-06-14: qty 20, 10d supply, fill #0

## 2022-06-14 NOTE — Progress Notes (Signed)
   Subjective:    Patient ID: Benjamin Lopez, male    DOB: Jun 09, 1966, 56 y.o.   MRN: [id number]  HPI 56 year old Male seen for health maintenance exam and evaluation of medical issues. History of hypertension, hypothyroidism, hyperlipidemia and type 2 diabetes mellitus.  Hemoglobin A1c excellent at 5.9%. Try this for is elevated at 183 and LDL elevated at 110.  Total cholesterol 173.  He has a low HDL of 33.  Currently on rosuvastatin 20 mg daily.  Does not want to increase dose.  Blood pressure stable on multidrug regimen consisting of olmesartan HCTZ and amlodipine. Blood pressure excellent at 100/62.  Past medical history: Fractured right clavicle 1991, fracture right tibia 1983, mononucleosis at age 24.  History of poststreptococcal glomerulonephritis in 1996.  Fractured right elbow 1998.  Bilateral hydrocele repair in 1971.  History of duplication of left collecting system.  No known drug allergies.  Had colonoscopy in 2019.  He had 1 tubular adenoma.  Social history: Married.  2 daughters.  He does not smoke.  Social alcohol consumption.  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  Family history: Mother with history of coronary artery disease, impaired glucose tolerance, colon cancer, hypertension, COPD and hyperlipidemia as well as osteoarthritis.  Father's family history is unknown.  He is an only child.  Maternal aunt died from complications of COPD but had history of smoking, diabetes, breast cancer and hyperlipidemia.  He gets flu vaccine through employment.  Last COVID booster was in October 2022.  Vaccine update discussed.  Tdap is available through employee health.  He is to let me know date of this vaccine when completed.  Had pneumococcal 13 in September 2020.   Review of Systems no new complaints except for tick bite right thigh     Objective:   Physical Exam Vital signs reviewed.  Skin: Warm and  dry.  No cervical adenopathy.  No thyromegaly or carotid bruits.  Chest clear.  Cardiac exam: Regular rate and rhythm without murmur.  No ectopy.  Abdomen soft nondistended without hepatosplenomegaly masses or tenderness.  No lower extremity pitting edema.  Prostate is normal.  Brief neurological exam intact without gross focal deficits.       Assessment & Plan:   Hyperlipidemia-mixed.  Triglycerides elevated at 183 and LDL cholesterol elevated at 110.  He prefers not to increase statin medication from 20 mg to 40 mg at this time.  Essential hypertension treated with olmesartan HCTZ and amlodipine.  BP under excellent control.  Type 2 diabetes mellitus-hemoglobin A1c excellent at 5.9%.  He remains on metformin.  Hypothyroidism treated with Synthroid 0.112 mg daily.  TSH is stable.  Oddly his calcium is elevated.  He does not take calcium supplementation.  PTH was obtained and result was 39.  His serum creatinine was rechecked and was 1.11 with hydration.  His TSH is elevated at 4.56.  He does not want to change dose of Synthroid 0.112 mg at this time.  He will follow-up in March.  Low HDL of 33-he does do quite a bit of exercise when he is off work and gets a fair amount of exercise with work.  Plan: Continue current medications.  Work on diet and exercise.  No change in medications at the present time.  Follow-up in 6 months.  Vaccines discussed.

## 2022-06-15 ENCOUNTER — Other Ambulatory Visit (HOSPITAL_COMMUNITY): Payer: Self-pay

## 2022-06-15 LAB — CREATININE, SERUM: Creat: 1.11 mg/dL (ref 0.70–1.30)

## 2022-06-15 LAB — CALCIUM: Calcium: 10.6 mg/dL — ABNORMAL HIGH (ref 8.6–10.3)

## 2022-06-21 ENCOUNTER — Other Ambulatory Visit: Payer: 59

## 2022-06-22 LAB — PTH, INTACT AND CALCIUM
Calcium: 9.9 mg/dL (ref 8.6–10.3)
PTH: 39 pg/mL (ref 16–77)

## 2022-06-29 NOTE — Patient Instructions (Addendum)
It was a pleasure to see you today.  Calcium is elevated and intact PTH is negative for hyperparathyroidism.  Continue to watch diet and get exercise.  No change in medications.  Follow-up in 6 months.  Vaccines discussed.  Colonoscopy is up-to-date.

## 2022-09-12 ENCOUNTER — Other Ambulatory Visit: Payer: Self-pay | Admitting: Internal Medicine

## 2022-09-12 ENCOUNTER — Other Ambulatory Visit (HOSPITAL_COMMUNITY): Payer: Self-pay

## 2022-09-12 MED ORDER — SYNTHROID 112 MCG PO TABS
112.0000 ug | ORAL_TABLET | Freq: Every day | ORAL | 1 refills | Status: DC
  Filled 2022-09-12: qty 90, 90d supply, fill #0
  Filled 2023-01-09 – 2023-01-12 (×2): qty 90, 90d supply, fill #1

## 2022-09-13 ENCOUNTER — Other Ambulatory Visit (HOSPITAL_BASED_OUTPATIENT_CLINIC_OR_DEPARTMENT_OTHER): Payer: Self-pay

## 2022-09-21 DIAGNOSIS — H5213 Myopia, bilateral: Secondary | ICD-10-CM | POA: Diagnosis not present

## 2022-09-21 DIAGNOSIS — H524 Presbyopia: Secondary | ICD-10-CM | POA: Diagnosis not present

## 2022-09-21 DIAGNOSIS — H52203 Unspecified astigmatism, bilateral: Secondary | ICD-10-CM | POA: Diagnosis not present

## 2022-09-21 LAB — HM DIABETES EYE EXAM

## 2022-09-22 ENCOUNTER — Encounter: Payer: Self-pay | Admitting: Internal Medicine

## 2022-10-24 ENCOUNTER — Other Ambulatory Visit (HOSPITAL_COMMUNITY): Payer: Self-pay

## 2022-12-06 NOTE — Progress Notes (Signed)
Patient Care Team: Elby Showers, MD as PCP - General (Internal Medicine)  Visit Date: 12/13/22  Subjective:    Patient ID: Benjamin Lopez , Male   DOB: 1966/05/26, 57 y.o.    MRN: [id number]   57 y.o. Male presents today for a 6 month follow-up. Patient has a past medical history of allergic rhinitis, Type 2 diabetes mellitus, GERD,   hyperlipidemia, essential hypertension, and hypothyroidism.  History of Type 2 diabetes treated with Glucophage 500 mg twice daily with meals. HGBA1c at 6.5% on 12/07/22. He has not been exercising. Gained 11 pounds between 06/14/22 and today.  History of hyperlipidemia treated with Crestor 20 mg daily, tricor 145 mg daily. Results of lipid panel: HDL low at 32, TRIG elevated at 193 on 12/08/22, Cholesterol normal at 142 and LDL cholesterol 81. Not exercising as much which tends to help his HDL. Liver panel normal except for very mild elevation of AST at 36  History of hypertension treated with Norvasc 5 mg daily, olmesartan-hydrochlorothiazide 40-25 mg daily. Blood pressure normal today at 106/68.  History of hypothyroidism treated with Synthroid 112 mcg daily. TSH normal at 3.92 on 12/08/22.   Past Medical History:  Diagnosis Date   Allergy    mild- prn Zyrtec   Arthritis    knees and hips   Diabetes mellitus without complication (HCC)    on metformin- diet controlled   Fracture of fibula, right, closed 1998   Fracture of right clavicle 1981   Fracture of tibia, right, closed 1983   GERD (gastroesophageal reflux disease)    occ and uses Nexium PRN only    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   History of mononucleosis    at age 69   Hx of colonic polyps 06/13/2018   Hyperlipidemia    Hypertension    Thyroid disease      Family History  Problem Relation Age of Onset   Hyperlipidemia Mother    Hypertension Mother    Heart attack Mother    Diabetes Mother    Colon cancer Mother 67       dx at 54   Diabetes Maternal Grandmother     Depression Maternal Grandfather    Hyperlipidemia Maternal Grandfather    Heart disease Maternal Grandfather    Diabetes Paternal Grandmother    CAD Paternal Grandfather    Diabetes Maternal Aunt    Hyperlipidemia Maternal Aunt    Depression Maternal Aunt    Colon polyps Maternal Uncle    Esophageal cancer Neg Hx    Rectal cancer Neg Hx    Stomach cancer Neg Hx     Social History   Social History Narrative   He is married.  2 daughters.  Does not smoke.  Social alcohol consumption.  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      Review of Systems  Constitutional:  Negative for fever and malaise/fatigue.  HENT:  Negative for congestion.   Eyes:  Negative for blurred vision.  Respiratory:  Negative for cough and shortness of breath.   Cardiovascular:  Negative for chest pain, palpitations and leg swelling.  Gastrointestinal:  Negative for vomiting.  Musculoskeletal:  Negative for back pain.  Skin:  Negative for rash.  Neurological:  Negative for loss of consciousness and headaches.        Objective:   Vitals: BP 106/68   Pulse 70   Temp 98.6 F (37 C) (Tympanic)   Ht 5'  11" (1.803 m)   Wt 224 lb 12.8 oz (102 kg)   SpO2 99%   BMI 31.35 kg/m    Physical Exam Constitutional:      General: He is not in acute distress.    Appearance: Normal appearance. He is not ill-appearing.  HENT:     Head: Normocephalic and atraumatic.  Cardiovascular:     Rate and Rhythm: Normal rate and regular rhythm.     Pulses: Normal pulses.     Heart sounds: Normal heart sounds. No murmur heard.    No friction rub. No gallop.  Pulmonary:     Effort: Pulmonary effort is normal. No respiratory distress.     Breath sounds: Normal breath sounds. No wheezing or rales.  Skin:    General: Skin is warm and dry.  Neurological:     Mental Status: He is alert and oriented to person, place, and time. Mental status is at baseline.  Psychiatric:        Mood and  Affect: Mood normal.        Behavior: Behavior normal.        Thought Content: Thought content normal.        Judgment: Judgment normal.       Results:   Studies obtained and personally reviewed by me:   Labs:       Component Value Date/Time   NA 140 06/09/2022 0907   K 4.2 06/09/2022 0907   CL 103 06/09/2022 0907   CO2 28 06/09/2022 0907   GLUCOSE 99 06/09/2022 0907   BUN 27 (H) 06/09/2022 0907   CREATININE 1.11 06/14/2022 1054   CALCIUM 9.9 06/21/2022 0920   PROT 7.5 12/08/2022 0933   ALBUMIN 4.7 05/05/2017 1022   AST 36 (H) 12/08/2022 0933   ALT 46 12/08/2022 0933   ALKPHOS 57 05/05/2017 1022   BILITOT 0.5 12/08/2022 0933   GFRNONAA 70 06/27/2019 0916   GFRAA 81 06/27/2019 0916     Lab Results  Component Value Date   WBC 3.5 (L) 06/09/2022   HGB 14.9 06/09/2022   HCT 45.4 06/09/2022   MCV 80.8 06/09/2022   PLT 270 06/09/2022    Lab Results  Component Value Date   CHOL 142 12/08/2022   HDL 32 (L) 12/08/2022   LDLCALC 81 12/08/2022   TRIG 193 (H) 12/08/2022   CHOLHDL 4.4 12/08/2022    Lab Results  Component Value Date   HGBA1C 6.5 (H) 12/08/2022     Lab Results  Component Value Date   TSH 3.92 12/08/2022     Lab Results  Component Value Date   PSA 1.11 06/09/2022   PSA 0.66 06/08/2021   PSA 0.6 06/27/2019      Assessment & Plan:   Type 2 diabetes: treated with Glucophage 500 mg twice daily with meals. HGBA1c at 6.5% on 12/07/22. He has not been exercising. Gained 11 pounds between 06/14/22 and today.  Hyperlipidemia: treated with Crestor 20 mg daily, tricor 145 mg daily. HDL low at 32, TRIG elevated at 193 on 12/08/22. He would like to try controlling with healthy diet and exercise before changing medications.  Hypertension: treated with Norvasc 5 mg daily, olmesartan-hydrochlorothiazide 40-25 mg daily. Blood pressure normal today at 106/68.  Hypothyroidism: treated with Synthroid 112 mcg daily. TSH normal at 3.92 on 12/08/22.  Vaccine  Counseling: Due for tetanus vaccine. Discussed shingles vaccine.He will obtain tetanus vaccine at [facility] 31.35- work on diet, exercise and weight loss.  Colonoscopy done in  2019.Recall due this year.   Return in 3 months for A1c and lipid check.Work on diet exercise and weight loss.    I,Alexander Ruley,acting as a Education administrator for Elby Showers, MD.,have documented all relevant documentation on the behalf of Elby Showers, MD,as directed by  Elby Showers, MD while in the presence of Elby Showers, MD.   I, Elby Showers, MD, have reviewed all documentation for this visit. The documentation on 12/13/22 for the exam, diagnosis, procedures, and orders are all accurate and complete.

## 2022-12-08 ENCOUNTER — Other Ambulatory Visit: Payer: Commercial Managed Care - PPO

## 2022-12-08 DIAGNOSIS — E782 Mixed hyperlipidemia: Secondary | ICD-10-CM | POA: Diagnosis not present

## 2022-12-08 DIAGNOSIS — E119 Type 2 diabetes mellitus without complications: Secondary | ICD-10-CM | POA: Diagnosis not present

## 2022-12-08 DIAGNOSIS — E039 Hypothyroidism, unspecified: Secondary | ICD-10-CM | POA: Diagnosis not present

## 2022-12-09 LAB — HEPATIC FUNCTION PANEL
AG Ratio: 1.8 (calc) (ref 1.0–2.5)
ALT: 46 U/L (ref 9–46)
AST: 36 U/L — ABNORMAL HIGH (ref 10–35)
Albumin: 4.8 g/dL (ref 3.6–5.1)
Alkaline phosphatase (APISO): 42 U/L (ref 35–144)
Bilirubin, Direct: 0.1 mg/dL (ref 0.0–0.2)
Globulin: 2.7 g/dL (calc) (ref 1.9–3.7)
Indirect Bilirubin: 0.4 mg/dL (calc) (ref 0.2–1.2)
Total Bilirubin: 0.5 mg/dL (ref 0.2–1.2)
Total Protein: 7.5 g/dL (ref 6.1–8.1)

## 2022-12-09 LAB — MICROALBUMIN / CREATININE URINE RATIO
Creatinine, Urine: 126 mg/dL (ref 20–320)
Microalb Creat Ratio: 2 mcg/mg creat (ref <30)
Microalb, Ur: 0.3 mg/dL

## 2022-12-09 LAB — LIPID PANEL
Cholesterol: 142 mg/dL (ref <200)
HDL: 32 mg/dL — ABNORMAL LOW (ref >=40)
LDL Cholesterol (Calc): 81 mg/dL (calc)
Non-HDL Cholesterol (Calc): 110 mg/dL (calc) (ref <130)
Total CHOL/HDL Ratio: 4.4 (calc) (ref <5.0)
Triglycerides: 193 mg/dL — ABNORMAL HIGH (ref <150)

## 2022-12-09 LAB — HEMOGLOBIN A1C
Hgb A1c MFr Bld: 6.5 % of total Hgb — ABNORMAL HIGH (ref <5.7)
Mean Plasma Glucose: 140 mg/dL
eAG (mmol/L): 7.7 mmol/L

## 2022-12-09 LAB — TSH: TSH: 3.92 mIU/L (ref 0.40–4.50)

## 2022-12-13 ENCOUNTER — Ambulatory Visit: Payer: Commercial Managed Care - PPO | Admitting: Internal Medicine

## 2022-12-13 ENCOUNTER — Encounter: Payer: Self-pay | Admitting: Internal Medicine

## 2022-12-13 VITALS — BP 106/68 | HR 70 | Temp 98.6°F | Ht 71.0 in | Wt 224.8 lb

## 2022-12-13 DIAGNOSIS — E8881 Metabolic syndrome: Secondary | ICD-10-CM

## 2022-12-13 DIAGNOSIS — I10 Essential (primary) hypertension: Secondary | ICD-10-CM

## 2022-12-13 DIAGNOSIS — E039 Hypothyroidism, unspecified: Secondary | ICD-10-CM

## 2022-12-13 DIAGNOSIS — E119 Type 2 diabetes mellitus without complications: Secondary | ICD-10-CM | POA: Diagnosis not present

## 2022-12-13 DIAGNOSIS — E782 Mixed hyperlipidemia: Secondary | ICD-10-CM | POA: Diagnosis not present

## 2022-12-13 DIAGNOSIS — Z6831 Body mass index (BMI) 31.0-31.9, adult: Secondary | ICD-10-CM

## 2022-12-13 NOTE — Patient Instructions (Signed)
It was a pleasure to see you today. Please work on diet exercise and weight loss. Colonoscopy due this year with Dr. Carlean Purl. Please have Tdap done through [employer] and message [place] with the date of the vaccine.

## 2023-01-09 ENCOUNTER — Other Ambulatory Visit: Payer: Self-pay | Admitting: Internal Medicine

## 2023-01-09 ENCOUNTER — Other Ambulatory Visit (HOSPITAL_COMMUNITY): Payer: Self-pay

## 2023-01-09 MED ORDER — FENOFIBRATE 145 MG PO TABS
145.0000 mg | ORAL_TABLET | Freq: Every day | ORAL | 3 refills | Status: DC
  Filled 2023-01-09: qty 90, 90d supply, fill #0
  Filled 2023-05-30: qty 90, 90d supply, fill #1
  Filled 2023-08-28: qty 90, 90d supply, fill #2
  Filled 2023-12-27: qty 90, 90d supply, fill #3

## 2023-01-09 MED ORDER — ROSUVASTATIN CALCIUM 20 MG PO TABS
20.0000 mg | ORAL_TABLET | Freq: Every day | ORAL | 3 refills | Status: DC
  Filled 2023-01-09: qty 90, 90d supply, fill #0
  Filled 2023-05-30: qty 90, 90d supply, fill #1
  Filled 2023-08-28: qty 90, 90d supply, fill #2
  Filled 2023-12-27: qty 90, 90d supply, fill #3

## 2023-01-09 MED ORDER — AMLODIPINE BESYLATE 5 MG PO TABS
5.0000 mg | ORAL_TABLET | Freq: Every day | ORAL | 1 refills | Status: DC
  Filled 2023-01-09: qty 90, 90d supply, fill #0
  Filled 2023-05-30: qty 90, 90d supply, fill #1

## 2023-01-10 ENCOUNTER — Other Ambulatory Visit (HOSPITAL_COMMUNITY): Payer: Self-pay

## 2023-01-10 ENCOUNTER — Encounter (HOSPITAL_COMMUNITY): Payer: Self-pay

## 2023-01-10 ENCOUNTER — Other Ambulatory Visit: Payer: Self-pay

## 2023-01-12 ENCOUNTER — Other Ambulatory Visit (HOSPITAL_COMMUNITY): Payer: Self-pay

## 2023-01-13 ENCOUNTER — Other Ambulatory Visit (HOSPITAL_COMMUNITY): Payer: Self-pay

## 2023-03-14 NOTE — Progress Notes (Shared)
Patient Care Team: Margaree Mackintosh, MD as PCP - General (Internal Medicine)  Visit Date: 03/14/23  Subjective:    Patient ID: Benjamin Lopez , Male   DOB: 1966/10/03, 57 y.o.    MRN: [id number]   57 y.o. Male presents today for a 3 month follow-up.  History of Type 2 diabetes treated with Glucophage 500 mg twice daily with meals. HGBA1c at 6.5% on 12/07/22. He has not been exercising.   History of hyperlipidemia treated with Crestor 20 mg daily, tricor 145 mg daily.    History of hypertension treated with Norvasc 5 mg daily, olmesartan-hydrochlorothiazide 40-25 mg daily. Blood pressure normal today at    Past Medical History:  Diagnosis Date   Allergy    mild- prn Zyrtec   Arthritis    knees and hips   Diabetes mellitus without complication (HCC)    on metformin- diet controlled   Fracture of fibula, right, closed 1998   Fracture of right clavicle 1981   Fracture of tibia, right, closed 1983   GERD (gastroesophageal reflux disease)    occ and uses Nexium PRN only    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   History of mononucleosis    at age 57   Hx of colonic polyps 06/13/2018   Hyperlipidemia    Hypertension    Thyroid disease      Family History  Problem Relation Age of Onset   Hyperlipidemia Mother    Hypertension Mother    Heart attack Mother    Diabetes Mother    Colon cancer Mother 61       dx at 47   Diabetes Maternal Grandmother    Depression Maternal Grandfather    Hyperlipidemia Maternal Grandfather    Heart disease Maternal Grandfather    Diabetes Paternal Grandmother    CAD Paternal Grandfather    Diabetes Maternal Aunt    Hyperlipidemia Maternal Aunt    Depression Maternal Aunt    Colon polyps Maternal Uncle    Esophageal cancer Neg Hx    Rectal cancer Neg Hx    Stomach cancer Neg Hx     Social History   Social History Narrative   He is married.  2 daughters.  Does not smoke.  Social alcohol consumption.  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      ROS      Objective:   Vitals: There were no vitals taken for this visit.   Physical Exam    Results:   Studies obtained and personally reviewed by me:  Imaging, colonoscopy, mammogram, bone density scan, echocardiogram, heart cath, stress test, CT calcium score, etc. ***   Labs:       Component Value Date/Time   NA 140 06/09/2022 0907   K 4.2 06/09/2022 0907   CL 103 06/09/2022 0907   CO2 28 06/09/2022 0907   GLUCOSE 99 06/09/2022 0907   BUN 27 (H) 06/09/2022 0907   CREATININE 1.11 06/14/2022 1054   CALCIUM 9.9 06/21/2022 0920   PROT 7.5 12/08/2022 0933   ALBUMIN 4.7 05/05/2017 1022   AST 36 (H) 12/08/2022 0933   ALT 46 12/08/2022 0933   ALKPHOS 57 05/05/2017 1022   BILITOT 0.5 12/08/2022 0933   GFRNONAA 70 06/27/2019 0916   GFRAA 81 06/27/2019 0916     Lab Results  Component Value Date   WBC 3.5 (L) 06/09/2022   HGB 14.9 06/09/2022   HCT 45.4 06/09/2022  MCV 80.8 06/09/2022   PLT 270 06/09/2022    Lab Results  Component Value Date   CHOL 142 12/08/2022   HDL 32 (L) 12/08/2022   LDLCALC 81 12/08/2022   TRIG 193 (H) 12/08/2022   CHOLHDL 4.4 12/08/2022    Lab Results  Component Value Date   HGBA1C 6.5 (H) 12/08/2022     Lab Results  Component Value Date   TSH 3.92 12/08/2022     Lab Results  Component Value Date   PSA 1.11 06/09/2022   PSA 0.66 06/08/2021   PSA 0.6 06/27/2019   *** delete for male pts  ***    Assessment & Plan:   ***    I,Alexander Ruley,acting as a scribe for Margaree Mackintosh, MD.,have documented all relevant documentation on the behalf of Margaree Mackintosh, MD,as directed by  Margaree Mackintosh, MD while in the presence of Margaree Mackintosh, MD.   ***

## 2023-03-16 ENCOUNTER — Other Ambulatory Visit: Payer: Commercial Managed Care - PPO

## 2023-03-16 DIAGNOSIS — E782 Mixed hyperlipidemia: Secondary | ICD-10-CM

## 2023-03-16 DIAGNOSIS — E119 Type 2 diabetes mellitus without complications: Secondary | ICD-10-CM

## 2023-03-21 ENCOUNTER — Ambulatory Visit: Payer: Commercial Managed Care - PPO | Admitting: Internal Medicine

## 2023-04-26 ENCOUNTER — Other Ambulatory Visit: Payer: Self-pay | Admitting: Internal Medicine

## 2023-04-27 ENCOUNTER — Other Ambulatory Visit: Payer: Self-pay

## 2023-04-27 ENCOUNTER — Other Ambulatory Visit (HOSPITAL_COMMUNITY): Payer: Self-pay

## 2023-04-27 MED ORDER — SYNTHROID 112 MCG PO TABS
112.0000 ug | ORAL_TABLET | Freq: Every day | ORAL | 0 refills | Status: DC
  Filled 2023-04-27: qty 90, 90d supply, fill #0

## 2023-05-30 ENCOUNTER — Other Ambulatory Visit (HOSPITAL_COMMUNITY): Payer: Self-pay

## 2023-05-30 ENCOUNTER — Other Ambulatory Visit: Payer: Self-pay | Admitting: Internal Medicine

## 2023-05-30 ENCOUNTER — Other Ambulatory Visit: Payer: Self-pay

## 2023-05-30 MED ORDER — OLMESARTAN MEDOXOMIL-HCTZ 40-25 MG PO TABS
1.0000 | ORAL_TABLET | Freq: Every day | ORAL | 1 refills | Status: DC
  Filled 2023-05-30 – 2023-06-08 (×2): qty 90, 90d supply, fill #0
  Filled 2023-08-28: qty 90, 90d supply, fill #1

## 2023-05-31 ENCOUNTER — Other Ambulatory Visit: Payer: Self-pay

## 2023-06-06 ENCOUNTER — Other Ambulatory Visit: Payer: Self-pay

## 2023-06-08 ENCOUNTER — Other Ambulatory Visit (HOSPITAL_COMMUNITY): Payer: Self-pay

## 2023-06-08 ENCOUNTER — Other Ambulatory Visit: Payer: Self-pay

## 2023-06-20 ENCOUNTER — Other Ambulatory Visit: Payer: Commercial Managed Care - PPO

## 2023-06-20 DIAGNOSIS — E782 Mixed hyperlipidemia: Secondary | ICD-10-CM

## 2023-06-20 DIAGNOSIS — I10 Essential (primary) hypertension: Secondary | ICD-10-CM | POA: Diagnosis not present

## 2023-06-20 DIAGNOSIS — E8881 Metabolic syndrome: Secondary | ICD-10-CM | POA: Diagnosis not present

## 2023-06-20 DIAGNOSIS — Z0000 Encounter for general adult medical examination without abnormal findings: Secondary | ICD-10-CM

## 2023-06-20 DIAGNOSIS — E119 Type 2 diabetes mellitus without complications: Secondary | ICD-10-CM | POA: Diagnosis not present

## 2023-06-20 DIAGNOSIS — E039 Hypothyroidism, unspecified: Secondary | ICD-10-CM

## 2023-06-21 LAB — CBC WITH DIFFERENTIAL/PLATELET
Absolute Monocytes: 277 {cells}/uL (ref 200–950)
Basophils Absolute: 22 {cells}/uL (ref 0–200)
Basophils Relative: 0.5 %
Eosinophils Absolute: 62 {cells}/uL (ref 15–500)
Eosinophils Relative: 1.4 %
HCT: 45.9 % (ref 38.5–50.0)
Hemoglobin: 15.5 g/dL (ref 13.2–17.1)
Lymphs Abs: 1971 {cells}/uL (ref 850–3900)
MCH: 27 pg (ref 27.0–33.0)
MCHC: 33.8 g/dL (ref 32.0–36.0)
MCV: 80 fL (ref 80.0–100.0)
MPV: 10.6 fL (ref 7.5–12.5)
Monocytes Relative: 6.3 %
Neutro Abs: 2068 {cells}/uL (ref 1500–7800)
Neutrophils Relative %: 47 %
Platelets: 298 10*3/uL (ref 140–400)
RBC: 5.74 10*6/uL (ref 4.20–5.80)
RDW: 12.5 % (ref 11.0–15.0)
Total Lymphocyte: 44.8 %
WBC: 4.4 10*3/uL (ref 3.8–10.8)

## 2023-06-22 ENCOUNTER — Ambulatory Visit: Payer: Commercial Managed Care - PPO | Admitting: Internal Medicine

## 2023-06-22 ENCOUNTER — Encounter: Payer: Self-pay | Admitting: Internal Medicine

## 2023-06-22 VITALS — BP 102/70 | HR 87 | Ht 71.0 in | Wt 222.0 lb

## 2023-06-22 DIAGNOSIS — Z0000 Encounter for general adult medical examination without abnormal findings: Secondary | ICD-10-CM

## 2023-06-22 DIAGNOSIS — Z800 Family history of malignant neoplasm of digestive organs: Secondary | ICD-10-CM

## 2023-06-22 DIAGNOSIS — Z7185 Encounter for immunization safety counseling: Secondary | ICD-10-CM

## 2023-06-22 DIAGNOSIS — I10 Essential (primary) hypertension: Secondary | ICD-10-CM

## 2023-06-22 DIAGNOSIS — E786 Lipoprotein deficiency: Secondary | ICD-10-CM

## 2023-06-22 DIAGNOSIS — Z6830 Body mass index (BMI) 30.0-30.9, adult: Secondary | ICD-10-CM | POA: Diagnosis not present

## 2023-06-22 DIAGNOSIS — E039 Hypothyroidism, unspecified: Secondary | ICD-10-CM | POA: Diagnosis not present

## 2023-06-22 DIAGNOSIS — Z23 Encounter for immunization: Secondary | ICD-10-CM

## 2023-06-22 DIAGNOSIS — E782 Mixed hyperlipidemia: Secondary | ICD-10-CM | POA: Diagnosis not present

## 2023-06-22 DIAGNOSIS — E119 Type 2 diabetes mellitus without complications: Secondary | ICD-10-CM | POA: Diagnosis not present

## 2023-06-22 LAB — POCT URINALYSIS DIP (CLINITEK)
Bilirubin, UA: NEGATIVE
Blood, UA: NEGATIVE
Glucose, UA: NEGATIVE mg/dL
Ketones, POC UA: NEGATIVE mg/dL
Leukocytes, UA: NEGATIVE
Nitrite, UA: NEGATIVE
POC PROTEIN,UA: NEGATIVE
Spec Grav, UA: 1.015 (ref 1.010–1.025)
Urobilinogen, UA: 0.2 E.U./dL
pH, UA: 6 (ref 5.0–8.0)

## 2023-06-22 NOTE — Patient Instructions (Addendum)
Endocrinology referral for elevated Hgb AIC. Needs to get tetanus update through employment.  Other vaccines discussed appropriate for age. Thyroid functions stable. Needs to work on diet,exercise and weight loss. RTC in 6 months.

## 2023-07-11 ENCOUNTER — Ambulatory Visit: Payer: Commercial Managed Care - PPO | Admitting: "Endocrinology

## 2023-07-25 ENCOUNTER — Other Ambulatory Visit: Payer: Self-pay

## 2023-07-25 ENCOUNTER — Other Ambulatory Visit (HOSPITAL_COMMUNITY): Payer: Self-pay

## 2023-07-25 ENCOUNTER — Other Ambulatory Visit: Payer: Self-pay | Admitting: Internal Medicine

## 2023-07-25 MED ORDER — AMLODIPINE BESYLATE 5 MG PO TABS
5.0000 mg | ORAL_TABLET | Freq: Every day | ORAL | 1 refills | Status: DC
  Filled 2023-07-25 – 2023-08-28 (×2): qty 90, 90d supply, fill #0
  Filled 2023-12-27: qty 90, 90d supply, fill #1

## 2023-07-25 MED ORDER — METFORMIN HCL 500 MG PO TABS
500.0000 mg | ORAL_TABLET | Freq: Two times a day (BID) | ORAL | 1 refills | Status: DC
  Filled 2023-07-25: qty 180, 90d supply, fill #0

## 2023-07-25 MED ORDER — SYNTHROID 112 MCG PO TABS
112.0000 ug | ORAL_TABLET | Freq: Every day | ORAL | 0 refills | Status: DC
  Filled 2023-07-25: qty 90, 90d supply, fill #0

## 2023-08-09 ENCOUNTER — Ambulatory Visit: Payer: Commercial Managed Care - PPO | Admitting: "Endocrinology

## 2023-08-15 ENCOUNTER — Ambulatory Visit: Payer: Commercial Managed Care - PPO | Admitting: "Endocrinology

## 2023-08-15 ENCOUNTER — Encounter: Payer: Self-pay | Admitting: "Endocrinology

## 2023-08-15 VITALS — BP 120/80 | HR 79 | Ht 71.0 in | Wt 214.6 lb

## 2023-08-15 DIAGNOSIS — E1165 Type 2 diabetes mellitus with hyperglycemia: Secondary | ICD-10-CM

## 2023-08-15 DIAGNOSIS — E782 Mixed hyperlipidemia: Secondary | ICD-10-CM | POA: Diagnosis not present

## 2023-08-15 DIAGNOSIS — Z7984 Long term (current) use of oral hypoglycemic drugs: Secondary | ICD-10-CM

## 2023-08-15 MED ORDER — BLOOD GLUCOSE TEST VI STRP
1.0000 | ORAL_STRIP | Freq: Three times a day (TID) | 3 refills | Status: AC
  Filled 2023-08-15: qty 100, 34d supply, fill #0

## 2023-08-15 MED ORDER — BLOOD GLUCOSE MONITOR SYSTEM W/DEVICE KIT
1.0000 | PACK | Freq: Three times a day (TID) | 0 refills | Status: AC
  Filled 2023-08-15: qty 1, 30d supply, fill #0

## 2023-08-15 MED ORDER — LANCET DEVICE MISC
1.0000 | Freq: Three times a day (TID) | 0 refills | Status: AC
  Filled 2023-08-15: qty 1, 30d supply, fill #0

## 2023-08-15 MED ORDER — METFORMIN HCL 500 MG PO TABS
1000.0000 mg | ORAL_TABLET | Freq: Two times a day (BID) | ORAL | 2 refills | Status: DC
  Filled 2023-08-15 – 2023-10-19 (×3): qty 180, 45d supply, fill #0
  Filled 2023-12-27: qty 180, 45d supply, fill #1
  Filled 2024-03-18: qty 180, 45d supply, fill #2

## 2023-08-15 MED ORDER — FREESTYLE LANCETS MISC
1.0000 | Freq: Three times a day (TID) | 3 refills | Status: AC
  Filled 2023-08-15: qty 100, 30d supply, fill #0

## 2023-08-15 NOTE — Progress Notes (Signed)
Outpatient Endocrinology Note Benjamin Tiger, MD  08/15/23   Benjamin Lopez 12-10-1965 [id number]  Referring Provider: Margaree Mackintosh, MD Primary Care Provider: Margaree Mackintosh, MD Reason for consultation: Subjective   Assessment & Plan  Diagnoses and all orders for this visit:  Uncontrolled type 2 diabetes mellitus with hyperglycemia (HCC)  Long term (current) use of oral hypoglycemic drugs  Mixed hypercholesterolemia and hypertriglyceridemia  Other orders -     Blood Glucose Monitoring Suppl DEVI; 1 each by Does not apply route in the morning, at noon, and at bedtime. May substitute to any manufacturer covered by patient's insurance. -     Glucose Blood (BLOOD GLUCOSE TEST STRIPS) STRP; 1 each by In Vitro route in the morning, at noon, and at bedtime. May substitute to any manufacturer covered by patient's insurance. -     Lancet Device MISC; 1 each by Does not apply route in the morning, at noon, and at bedtime. May substitute to any manufacturer covered by patient's insurance. -     Lancets Misc. MISC; 1 each by Does not apply route in the morning, at noon, and at bedtime. May substitute to any manufacturer covered by patient's insurance. -     metFORMIN (GLUCOPHAGE) 500 MG tablet; Take 2 tablets (1,000 mg total) by mouth 2 (two) times daily with a meal.    Diabetes Type II complicated by hyperglycemia, No results found for: "GFR" Hba1c goal less than 7, current Hba1c is  Lab Results  Component Value Date   HGBA1C 7.6 (H) 06/20/2023   Will recommend the following: Metformin 500 mg 2 pills bid   No known contraindications/side effects to any of above medications  -Last LD and Tg are as follows: Lab Results  Component Value Date   LDLCALC 105 (H) 06/20/2023    Lab Results  Component Value Date   TRIG 205 (H) 06/20/2023   -On rosuvastatin 20 mg QD -Follow low fat diet and exercise   -Blood pressure goal <140/90 - Microalbumin/creatinine goal is < 30 -Last  MA/Cr is as follows: Lab Results  Component Value Date   MICROALBUR 0.4 06/20/2023   -on ACE/ARB olmesartan 40 mg qd -diet changes including salt restriction -limit eating outside -counseled BP targets per standards of diabetes care -uncontrolled blood pressure can lead to retinopathy, nephropathy and cardiovascular and atherosclerotic heart disease  Reviewed and counseled on: -A1C target -Blood sugar targets -Complications of uncontrolled diabetes  -Checking blood sugar before meals and bedtime and bring log next visit -All medications with mechanism of action and side effects -Hypoglycemia management: rule of 15's, Glucagon Emergency Kit and medical alert ID -low-carb low-fat plate-method diet -At least 20 minutes of physical activity per day -Annual dilated retinal eye exam and foot exam -compliance and follow up needs -follow up as scheduled or earlier if problem gets worse  Call if blood sugar is less than 70 or consistently above 250    Take a 15 gm snack of carbohydrate at bedtime before you go to sleep if your blood sugar is less than 100.    If you are going to fast after midnight for a test or procedure, ask your physician for instructions on how to reduce/decrease your insulin dose.    Call if blood sugar is less than 70 or consistently above 250  -Treating a low sugar by rule of 15  (15 gms of sugar every 15 min until sugar is more than 70) If you feel your sugar is low,  test your sugar to be sure If your sugar is low (less than 70), then take 15 grams of a fast acting Carbohydrate (3-4 glucose tablets or glucose gel or 4 ounces of juice or regular soda) Recheck your sugar 15 min after treating low to make sure it is more than 70 If sugar is still less than 70, treat again with 15 grams of carbohydrate          Don't drive the hour of hypoglycemia  If unconscious/unable to eat or drink by mouth, use glucagon injection or nasal spray baqsimi and call 911. Can repeat  again in 15 min if still unconscious.  Return in about 4 months (around 12/13/2023).   I have reviewed current medications, nurse's notes, allergies, vital signs, past medical and surgical history, family medical history, and social history for this encounter. Counseled patient on symptoms, examination findings, lab findings, imaging results, treatment decisions and monitoring and prognosis. The patient understood the recommendations and agrees with the treatment plan. All questions regarding treatment plan were fully answered.  Benjamin [place], MD  08/15/23    History of Present Illness Benjamin Lopez is a 57 y.o. year old male who presents for evaluation of Type 2 diabetes mellitus.  Husain Dewberry Schuyler was first diagnosed after 2019.   Diabetes education +  Home diabetes regimen: Metformin 500 mg bid   COMPLICATIONS -  MI/Stroke -  retinopathy -  neuropathy -  nephropathy  SYMPTOMS REVIEWED - Polyuria - Weight loss - Blurred vision  BLOOD SUGAR DATA Doesn't check sugar at home  Physical Exam  BP 120/80   Pulse 79   Ht 5\' 11"  (1.803 m)   Wt 214 lb 9.6 oz (97.3 kg)   SpO2 98%   BMI 29.93 kg/m    Constitutional: well developed, well nourished Head: normocephalic, atraumatic Eyes: sclera anicteric, no redness Neck: supple Lungs: normal respiratory effort Neurology: alert and oriented Skin: dry, no appreciable rashes Musculoskeletal: no appreciable defects Psychiatric: normal mood and affect Diabetic Foot Exam - Simple   No data filed      Current Medications Patient's Medications  New Prescriptions   BLOOD GLUCOSE MONITORING SUPPL DEVI    1 each by Does not apply route in the morning, at noon, and at bedtime. May substitute to any manufacturer covered by patient's insurance.   GLUCOSE BLOOD (BLOOD GLUCOSE TEST STRIPS) STRP    1 each by In Vitro route in the morning, at noon, and at bedtime. May substitute to any manufacturer covered by patient's insurance.   LANCET  DEVICE MISC    1 each by Does not apply route in the morning, at noon, and at bedtime. May substitute to any manufacturer covered by patient's insurance.   LANCETS MISC. MISC    1 each by Does not apply route in the morning, at noon, and at bedtime. May substitute to any manufacturer covered by patient's insurance.  Previous Medications   AMLODIPINE (NORVASC) 5 MG TABLET    Take 1 tablet (5 mg total) by mouth daily.   FENOFIBRATE (TRICOR) 145 MG TABLET    Take 1 tablet (145 mg total) by mouth daily.   OLMESARTAN-HYDROCHLOROTHIAZIDE (BENICAR HCT) 40-25 MG TABLET    Take 1 tablet by mouth daily.   ROSUVASTATIN (CRESTOR) 20 MG TABLET    Take 1 tablet (20 mg total) by mouth daily.   SYNTHROID 112 MCG TABLET    Take 1 tablet (112 mcg total) by mouth daily.   VALACYCLOVIR (VALTREX) 500  MG TABLET    Take 1 tablet by mouth 2 times daily.  Modified Medications   Modified Medication Previous Medication   METFORMIN (GLUCOPHAGE) 500 MG TABLET metFORMIN (GLUCOPHAGE) 500 MG tablet      Take 2 tablets (1,000 mg total) by mouth 2 (two) times daily with a meal.    Take 1 tablet (500 mg total) by mouth 2 (two) times daily with a meal.  Discontinued Medications   No medications on file    Allergies No Known Allergies  Past Medical History Past Medical History:  Diagnosis Date   Allergy    mild- prn Zyrtec   Arthritis    knees and hips   Diabetes mellitus without complication (HCC)    on metformin- diet controlled   Fracture of fibula, right, closed 1998   Fracture of right clavicle 1981   Fracture of tibia, right, closed 1983   GERD (gastroesophageal reflux disease)    occ and uses Nexium PRN only    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   History of mononucleosis    at age 61   Hx of colonic polyps 06/13/2018   Hyperlipidemia    Hypertension    Thyroid disease     Past Surgical History Past Surgical History:  Procedure Laterality Date   HYDROCELE EXCISION / REPAIR  1971    bilateral   WISDOM TOOTH EXTRACTION      Family History family history includes CAD in his paternal grandfather; Colon cancer (age of onset: 70) in his mother; Colon polyps in his maternal uncle; Depression in his maternal aunt and maternal grandfather; Diabetes in his maternal aunt, maternal grandmother, mother, and paternal grandmother; Heart attack in his mother; Heart disease in his maternal grandfather; Hyperlipidemia in his maternal aunt, maternal grandfather, and mother; Hypertension in his mother.  Social History Social History   Socioeconomic History   Marital status: Married    Spouse name: Not on file   Number of children: Not on file   Years of education: Not on file   Highest education level: Not on file  Occupational History    Employer: [employer]  Tobacco Use   Smoking status: Never   Smokeless tobacco: Never  Substance and Sexual Activity   Alcohol use: No   Drug use: No   Sexual activity: Not on file  Other Topics Concern   Not on file  Social History Narrative   He is married.  2 daughters.  Does not smoke.  Social alcohol consumption.  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   Social Determinants of Health   Financial Resource Strain: Not on file  Food Insecurity: Not on file  Transportation Needs: Not on file  Physical Activity: Not on file  Stress: Not on file  Social Connections: Not on file  Intimate Partner Violence: Not on file    Lab Results  Component Value Date   HGBA1C 7.6 (H) 06/20/2023   HGBA1C 6.5 (H) 12/08/2022   HGBA1C 5.9 (H) 06/09/2022   Lab Results  Component Value Date   CHOL 174 06/20/2023   Lab Results  Component Value Date   HDL 37 (L) 06/20/2023   Lab Results  Component Value Date   LDLCALC 105 (H) 06/20/2023   Lab Results  Component Value Date   TRIG 205 (H) 06/20/2023   Lab Results  Component Value Date   CHOLHDL 4.7 06/20/2023   Lab Results  Component Value Date    CREATININE  1.11 06/20/2023   No results found for: "GFR" Lab Results  Component Value Date   MICROALBUR 0.4 06/20/2023      Component Value Date/Time   NA 139 06/20/2023 0932   K 4.1 06/20/2023 0932   CL 104 06/20/2023 0932   CO2 25 06/20/2023 0932   GLUCOSE 114 (H) 06/20/2023 0932   BUN 24 06/20/2023 0932   CREATININE 1.11 06/20/2023 0932   CALCIUM 10.2 06/20/2023 0932   PROT 7.2 06/20/2023 0932   ALBUMIN 4.7 05/05/2017 1022   AST 34 06/20/2023 0932   ALT 52 (H) 06/20/2023 0932   ALKPHOS 57 05/05/2017 1022   BILITOT 0.5 06/20/2023 0932   GFRNONAA 70 06/27/2019 0916   GFRAA 81 06/27/2019 0916      Latest Ref Rng & Units 06/20/2023    9:32 AM 06/21/2022    9:20 AM 06/14/2022   10:54 AM  BMP  Glucose 65 - 99 mg/dL 161     BUN 7 - 25 mg/dL 24     Creatinine 0.96 - 1.30 mg/dL 0.45   4.09   BUN/Creat Ratio 6 - 22 (calc) SEE NOTE:     Sodium 135 - 146 mmol/L 139     Potassium 3.5 - 5.3 mmol/L 4.1     Chloride 98 - 110 mmol/L 104     CO2 20 - 32 mmol/L 25     Calcium 8.6 - 10.3 mg/dL 81.1  9.9  91.4        Component Value Date/Time   WBC 4.4 06/20/2023 0932   RBC 5.74 06/20/2023 0932   HGB 15.5 06/20/2023 0932   HCT 45.9 06/20/2023 0932   PLT 298 06/20/2023 0932   MCV 80.0 06/20/2023 0932   MCH 27.0 06/20/2023 0932   MCHC 33.8 06/20/2023 0932   RDW 12.5 06/20/2023 0932   LYMPHSABS 1,971 06/20/2023 0932   MONOABS 392 05/05/2017 1022   EOSABS 62 06/20/2023 0932   BASOSABS 22 06/20/2023 0932     Parts of this note may have been dictated using voice recognition software. There may be variances in spelling and vocabulary which are unintentional. Not all errors are proofread. Please notify the Thereasa Parkin if any discrepancies are noted or if the meaning of any statement is not clear.

## 2023-08-15 NOTE — Patient Instructions (Signed)

## 2023-08-16 ENCOUNTER — Other Ambulatory Visit (HOSPITAL_COMMUNITY): Payer: Self-pay

## 2023-08-16 ENCOUNTER — Other Ambulatory Visit: Payer: Self-pay

## 2023-08-16 ENCOUNTER — Encounter: Payer: Self-pay | Admitting: Internal Medicine

## 2023-08-17 ENCOUNTER — Other Ambulatory Visit (HOSPITAL_COMMUNITY): Payer: Self-pay

## 2023-08-17 ENCOUNTER — Other Ambulatory Visit: Payer: Self-pay

## 2023-08-28 ENCOUNTER — Other Ambulatory Visit (HOSPITAL_COMMUNITY): Payer: Self-pay

## 2023-08-28 ENCOUNTER — Other Ambulatory Visit: Payer: Self-pay

## 2023-09-20 DIAGNOSIS — H524 Presbyopia: Secondary | ICD-10-CM | POA: Diagnosis not present

## 2023-09-20 DIAGNOSIS — H5213 Myopia, bilateral: Secondary | ICD-10-CM | POA: Diagnosis not present

## 2023-09-20 DIAGNOSIS — H52203 Unspecified astigmatism, bilateral: Secondary | ICD-10-CM | POA: Diagnosis not present

## 2023-09-20 LAB — HM DIABETES EYE EXAM

## 2023-09-21 ENCOUNTER — Encounter: Payer: Self-pay | Admitting: Internal Medicine

## 2023-10-02 ENCOUNTER — Other Ambulatory Visit: Payer: Self-pay | Admitting: Internal Medicine

## 2023-10-02 ENCOUNTER — Other Ambulatory Visit (HOSPITAL_COMMUNITY): Payer: Self-pay

## 2023-10-02 ENCOUNTER — Other Ambulatory Visit: Payer: Self-pay

## 2023-10-02 MED ORDER — SYNTHROID 112 MCG PO TABS
112.0000 ug | ORAL_TABLET | Freq: Every day | ORAL | 0 refills | Status: DC
  Filled 2023-10-02 – 2023-10-19 (×2): qty 90, 90d supply, fill #0

## 2023-10-19 ENCOUNTER — Other Ambulatory Visit: Payer: Self-pay

## 2023-10-19 ENCOUNTER — Other Ambulatory Visit (HOSPITAL_COMMUNITY): Payer: Self-pay

## 2023-12-04 ENCOUNTER — Telehealth: Payer: Self-pay | Admitting: Internal Medicine

## 2023-12-04 NOTE — Telephone Encounter (Signed)
 LVM that we need to reschedule appointment on 12/21/2023 Dr Lenord Fellers is going to be in a meeting that morning.

## 2023-12-06 NOTE — Telephone Encounter (Signed)
 Sent MyChart message and I have also reschedule for 12/26/2023 @9 :30

## 2023-12-07 NOTE — Telephone Encounter (Signed)
 Canceled March lab appointment and need to reschedule closer to appointment in April.

## 2023-12-07 NOTE — Telephone Encounter (Signed)
 Copied from CRM [id number]. Topic: Appointments - Appointment Cancel/Reschedule >> Dec 07, 2023 12:12 PM Shardie S wrote: Patient/patient representative is calling to reschedule lab and 6 month appointment. 6 month appointment was rescheduled. Please contact pt to reschedule lab work.

## 2023-12-13 ENCOUNTER — Ambulatory Visit: Payer: Commercial Managed Care - PPO | Admitting: "Endocrinology

## 2023-12-19 ENCOUNTER — Other Ambulatory Visit: Payer: Commercial Managed Care - PPO

## 2023-12-21 ENCOUNTER — Ambulatory Visit: Payer: Commercial Managed Care - PPO | Admitting: Internal Medicine

## 2023-12-26 ENCOUNTER — Ambulatory Visit: Admitting: Internal Medicine

## 2023-12-27 ENCOUNTER — Other Ambulatory Visit: Payer: Self-pay | Admitting: Internal Medicine

## 2023-12-27 ENCOUNTER — Other Ambulatory Visit (HOSPITAL_COMMUNITY): Payer: Self-pay

## 2023-12-27 ENCOUNTER — Other Ambulatory Visit: Payer: Self-pay | Admitting: Family

## 2023-12-27 MED ORDER — SYNTHROID 112 MCG PO TABS
112.0000 ug | ORAL_TABLET | Freq: Every day | ORAL | 0 refills | Status: DC
  Filled 2023-12-27: qty 90, 90d supply, fill #0

## 2023-12-28 ENCOUNTER — Other Ambulatory Visit: Payer: Self-pay

## 2024-01-16 ENCOUNTER — Other Ambulatory Visit (HOSPITAL_COMMUNITY): Payer: Self-pay

## 2024-01-16 ENCOUNTER — Other Ambulatory Visit: Payer: Self-pay | Admitting: Family

## 2024-01-16 ENCOUNTER — Other Ambulatory Visit

## 2024-01-23 ENCOUNTER — Ambulatory Visit: Admitting: Internal Medicine

## 2024-01-25 ENCOUNTER — Other Ambulatory Visit: Payer: Self-pay | Admitting: Family

## 2024-01-25 ENCOUNTER — Other Ambulatory Visit (HOSPITAL_COMMUNITY): Payer: Self-pay

## 2024-01-26 ENCOUNTER — Other Ambulatory Visit (HOSPITAL_COMMUNITY): Payer: Self-pay

## 2024-01-29 ENCOUNTER — Other Ambulatory Visit (HOSPITAL_COMMUNITY): Payer: Self-pay

## 2024-01-30 ENCOUNTER — Ambulatory Visit: Admitting: "Endocrinology

## 2024-02-20 ENCOUNTER — Ambulatory Visit: Payer: Self-pay

## 2024-02-20 DIAGNOSIS — W57XXXA Bitten or stung by nonvenomous insect and other nonvenomous arthropods, initial encounter: Secondary | ICD-10-CM | POA: Diagnosis not present

## 2024-02-20 DIAGNOSIS — R21 Rash and other nonspecific skin eruption: Secondary | ICD-10-CM | POA: Diagnosis not present

## 2024-02-20 NOTE — Telephone Encounter (Signed)
  Chief Complaint: tick bite Symptoms: redness, tender, swollen Frequency: was attached for at least one day Pertinent Negatives: Patient denies fever, joint pain,  Disposition: [] ED /[x] Urgent Care (no appt availability in office) / [] Appointment(In office/virtual)/ []  [facility]/ [] Home Care/ [] Refused Recommended Disposition /[] [facility]/ []  Follow-up with PCP Additional Notes: Pt states that the tick was small with a white spot on its back. Pt states that it was attached for at least a day, it was engorged. Advised UC d/t no appt availability, states that he would like the doctor to call him.    Copied from CRM [id number]. Topic: Clinical - Red Word Triage >> Feb 20, 2024  4:19 PM Magdalene [employer] wrote: Red Word that prompted transfer to Nurse Triage: Tick bite. Reason for Disposition  Red ring or bull's-eye rash occurs at tick bite  Answer Assessment - Initial Assessment Questions 1. ATTACHED:  "Is the tick still on the skin?"  (e.g., yes, no, unsure)     No 2. ONSET - TICK STILL ATTACHED:  "How long do you think the tick has been on your skin?" (e.g., hours, days, unsure)  Note:  Is there a recent activity (camping, hiking) where the caller may have been exposed?     Not attached 3. ONSET - TICK NOT STILL ATTACHED: "If the tick has been removed, how long do you think the tick was attached before you removed it?" (e.g., 5 hours, 2 days). "When was this?"     Approx 1+ day 4. LOCATION: "Where is the tick bite located?" (e.g., arm, leg)     scrotum 5. TYPE of TICK: "Is it a wood tick or a deer tick?" (e.g., deer tick, wood tick; unsure)     Deer he believes, had a white spot on its back 6. SIZE of TICK: "How big is the tick?" (e.g., size of poppy seed, apple seed, watermelon seed; unsure) Note: Deer ticks can be the size of a poppy seed (nymph) or an apple seed (adult).       Apple seed, white spot on it 7. ENGORGED: "Did the tick look flat or engorged (full, swollen)?"  (e.g., flat, engorged; unsure)     yes 8. OTHER SYMPTOMS: "Do you have any other symptoms?" (e.g., fever, rash, redness at bite area, red ring around bite)     Redness, itching, localized swelling  Protocols used: Tick Bite-A-AH

## 2024-02-21 NOTE — Telephone Encounter (Signed)
 Patient went to urgent care last night he got a shot of rocephin  and a ten day course of doxycycline .

## 2024-03-05 ENCOUNTER — Ambulatory Visit: Admitting: "Endocrinology

## 2024-03-18 ENCOUNTER — Other Ambulatory Visit: Payer: Self-pay | Admitting: Family

## 2024-03-18 ENCOUNTER — Other Ambulatory Visit: Payer: Self-pay | Admitting: Internal Medicine

## 2024-03-19 ENCOUNTER — Encounter: Payer: Self-pay | Admitting: Pharmacist

## 2024-03-19 ENCOUNTER — Other Ambulatory Visit: Payer: Self-pay

## 2024-03-19 MED ORDER — ROSUVASTATIN CALCIUM 20 MG PO TABS
20.0000 mg | ORAL_TABLET | Freq: Every day | ORAL | 3 refills | Status: AC
  Filled 2024-03-19: qty 90, 90d supply, fill #0
  Filled 2024-07-08: qty 90, 90d supply, fill #1

## 2024-03-19 MED ORDER — SYNTHROID 112 MCG PO TABS
112.0000 ug | ORAL_TABLET | Freq: Every day | ORAL | 0 refills | Status: DC
  Filled 2024-03-19: qty 90, 90d supply, fill #0

## 2024-03-19 MED ORDER — FENOFIBRATE 145 MG PO TABS
145.0000 mg | ORAL_TABLET | Freq: Every day | ORAL | 3 refills | Status: AC
Start: 2024-03-19 — End: ?
  Filled 2024-03-19: qty 90, 90d supply, fill #0
  Filled 2024-07-08: qty 90, 90d supply, fill #1

## 2024-03-19 MED ORDER — AMLODIPINE BESYLATE 5 MG PO TABS
5.0000 mg | ORAL_TABLET | Freq: Every day | ORAL | 1 refills | Status: DC
  Filled 2024-03-19: qty 90, 90d supply, fill #0
  Filled 2024-06-16: qty 90, 90d supply, fill #1

## 2024-03-19 MED ORDER — VALACYCLOVIR HCL 500 MG PO TABS
500.0000 mg | ORAL_TABLET | Freq: Two times a day (BID) | ORAL | 1 refills | Status: AC
  Filled 2024-03-19: qty 10, 5d supply, fill #0

## 2024-03-20 ENCOUNTER — Other Ambulatory Visit (HOSPITAL_COMMUNITY): Payer: Self-pay

## 2024-03-21 ENCOUNTER — Other Ambulatory Visit (HOSPITAL_COMMUNITY): Payer: Self-pay

## 2024-03-28 ENCOUNTER — Other Ambulatory Visit: Payer: Self-pay | Admitting: Family

## 2024-03-28 ENCOUNTER — Other Ambulatory Visit (HOSPITAL_COMMUNITY): Payer: Self-pay

## 2024-03-29 ENCOUNTER — Other Ambulatory Visit (HOSPITAL_BASED_OUTPATIENT_CLINIC_OR_DEPARTMENT_OTHER): Payer: Self-pay

## 2024-04-18 ENCOUNTER — Ambulatory Visit: Admitting: "Endocrinology

## 2024-04-19 ENCOUNTER — Encounter: Payer: Self-pay | Admitting: Internal Medicine

## 2024-04-19 ENCOUNTER — Ambulatory Visit: Admitting: Internal Medicine

## 2024-04-19 VITALS — BP 160/90 | HR 82 | Ht 71.0 in | Wt 214.0 lb

## 2024-04-19 DIAGNOSIS — E782 Mixed hyperlipidemia: Secondary | ICD-10-CM

## 2024-04-19 DIAGNOSIS — R7302 Impaired glucose tolerance (oral): Secondary | ICD-10-CM | POA: Diagnosis not present

## 2024-04-19 DIAGNOSIS — Z634 Disappearance and death of family member: Secondary | ICD-10-CM

## 2024-04-19 DIAGNOSIS — E785 Hyperlipidemia, unspecified: Secondary | ICD-10-CM

## 2024-04-19 DIAGNOSIS — I10 Essential (primary) hypertension: Secondary | ICD-10-CM

## 2024-04-19 DIAGNOSIS — E039 Hypothyroidism, unspecified: Secondary | ICD-10-CM

## 2024-04-19 DIAGNOSIS — E119 Type 2 diabetes mellitus without complications: Secondary | ICD-10-CM

## 2024-04-19 MED ORDER — OLMESARTAN MEDOXOMIL-HCTZ 40-25 MG PO TABS: 1.0000 | ORAL_TABLET | Freq: Every day | ORAL | 0 refills | Status: AC

## 2024-04-19 MED ORDER — OLMESARTAN MEDOXOMIL 20 MG PO TABS: 20.0000 mg | ORAL_TABLET | Freq: Every day | ORAL | 0 refills | Status: DC

## 2024-04-19 NOTE — Progress Notes (Addendum)
 Patient Care Team: Perri Ronal PARAS, MD as PCP - General (Internal Medicine)  Visit Date: 04/19/24  Subjective:   Chief Complaint  Patient presents with   Blood Pressure Check   Vitals:   04/19/24 1422 04/19/24 1450 04/19/24 1517  BP: (!) 150/90 (!) 150/90 (!) 160/90   Patient PI:Benjamin Lopez,Male DOB:[birth year],58 y.o. FMW:[id number]   58 y.o.Male presents today for acute visit with Blood Pressure Check and follow up.. Patient has a past medical history of Hypertension treated with Amlodipine  5 mg daily and was previously on Benicar -HCT 40-25 mg, which he had stopped taking. Blood Pressure: elevated today at 150/90, 28 minutes later 150/90, and 37 minutes later 160/90. Says that he wanted to have his BP checked as there was a recent reading of 157/97 and he's had a headache for the past couple of days. Discussed resuming Benicar -HCT.  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   History of mononucleosis    at age 58   Hx of colonic polyps 06/13/2018   Hyperlipidemia    Hypertension    Thyroid  disease     No Known Allergies  Family History  Problem Relation Age of Onset   Hyperlipidemia Mother    Hypertension Mother    Heart attack Mother    Diabetes Mother    Colon cancer Mother 49       dx at 50   Diabetes Maternal Grandmother    Depression Maternal Grandfather    Hyperlipidemia Maternal Grandfather    Heart disease Maternal Grandfather    Diabetes Paternal Grandmother    CAD Paternal Grandfather    Diabetes Maternal Aunt    Hyperlipidemia Maternal Aunt    Depression Maternal Aunt    Colon  polyps Maternal Uncle    Esophageal cancer Neg Hx    Rectal cancer Neg Hx    Stomach cancer Neg Hx    Social Hx: Mother passed away recently. Situational stress discussed.He is married and has 2 daughters.  Review of Systems  Cardiovascular: Negative.   Neurological:  Positive for headaches.  All other systems reviewed and are negative.    Objective:  Vitals: BP (!) 160/90   Pulse 82   Ht 5' 11 (1.803 m)   Wt 214 lb (97.1 kg)   SpO2 97%   BMI 29.85 kg/m   Physical Exam Constitutional:      General: He is not in acute distress.    Appearance: Normal appearance. He is not ill-appearing.  HENT:     Head: Normocephalic and atraumatic.  Cardiovascular:     Rate and Rhythm: Normal rate and regular rhythm.     Pulses: Normal pulses.     Heart sounds: Normal heart sounds. No murmur heard.    No friction rub. No gallop.  Pulmonary:     Effort: Pulmonary effort is normal. No respiratory distress.     Breath sounds: Normal breath sounds. No wheezing or rales.  Skin:    General: Skin is warm and dry.  Neurological:     Mental Status: He  is alert and oriented to person, place, and time. Mental status is at baseline.  Psychiatric:        Mood and Affect: Mood normal.        Behavior: Behavior normal.        Thought Content: Thought content normal.        Judgment: Judgment normal.     Results:  Studies Obtained And Personally Reviewed By Me: Labs:     Component Value Date/Time   NA 139 06/20/2023 0932   K 4.1 06/20/2023 0932   CL 104 06/20/2023 0932   CO2 25 06/20/2023 0932   GLUCOSE 114 (H) 06/20/2023 0932   BUN 24 06/20/2023 0932   CREATININE 1.11 06/20/2023 0932   CALCIUM  10.2 06/20/2023 0932   PROT 7.2 06/20/2023 0932   ALBUMIN 4.7 05/05/2017 1022   AST 34 06/20/2023 0932   ALT 52 (H) 06/20/2023 0932   ALKPHOS 57 05/05/2017 1022   BILITOT 0.5 06/20/2023 0932   GFRNONAA 70 06/27/2019 0916   GFRAA 81 06/27/2019 0916    Lab Results  Component Value Date   WBC  4.4 06/20/2023   HGB 15.5 06/20/2023   HCT 45.9 06/20/2023   MCV 80.0 06/20/2023   PLT 298 06/20/2023   Lab Results  Component Value Date   CHOL 174 06/20/2023   HDL 37 (L) 06/20/2023   LDLCALC 105 (H) 06/20/2023   TRIG 205 (H) 06/20/2023   CHOLHDL 4.7 06/20/2023   Lab Results  Component Value Date   HGBA1C 7.6 (H) 06/20/2023    Lab Results  Component Value Date   TSH 3.92 12/08/2022    Lab Results  Component Value Date   PSA 0.75 06/20/2023   PSA 1.11 06/09/2022   PSA 0.66 06/08/2021     Assessment & Plan:   Meds ordered this encounter  Medications   DISCONTD: olmesartan  (BENICAR ) 20 MG tablet    Sig: Take 1 tablet (20 mg total) by mouth daily.    Dispense:  90 tablet    Refill:  0   olmesartan -hydrochlorothiazide  (BENICAR  HCT) 40-25 MG tablet    Sig: Take 1 tablet by mouth daily.    Dispense:  90 tablet    Refill:  0   Hypertension treated with Amlodipine  5 mg daily and was previously on Benicar -HCT 40-25 mg, which he had stopped taking. Blood Pressure: elevated today at 150/90, 28 minutes later 150/90, and 37 minutes later 160/90. Says that he wanted to have his BP checked as there was a recent reading of 157/97 and he's had a headache for the past couple of days. Discussed resuming Benicar -HCT, which he is agreeable to doing, so sending in Benicar -HCT 40-25 mg daily. He will monitor BP and let me know if not under good control  Impaired glucose tolerance- labs not checked today. Since mother passes has gotten off track with diet  Hyperlipidemia treated with Crestor  and fenofibrate .  Hypothyroidism treated with Synthroid  0.112 mg daily. TSH not checked today.  Situational stress- Mother passed away recently.  His CPE has been booked for October.     I,Emily Lagle,acting as a Neurosurgeon for Ronal JINNY Hailstone, MD.,have documented all relevant documentation on the behalf of Ronal JINNY Hailstone, MD,as directed by  Ronal JINNY Hailstone, MD while in the presence of Ronal JINNY Hailstone,  MD.   I, Ronal JINNY Hailstone, MD, have reviewed all documentation for this visit. The documentation on 04/29/24 for the exam, diagnosis, procedures, and orders are all accurate and complete.

## 2024-04-29 ENCOUNTER — Encounter: Payer: Self-pay | Admitting: Internal Medicine

## 2024-04-29 NOTE — Patient Instructions (Addendum)
 We are sorry about your mother's passing. I hope with time you have peace. Please take Benicar  hydrochlorothiazide  daily and monitor BP at home. CPE booked for October

## 2024-06-16 ENCOUNTER — Other Ambulatory Visit: Payer: Self-pay | Admitting: "Endocrinology

## 2024-06-17 ENCOUNTER — Other Ambulatory Visit (HOSPITAL_COMMUNITY): Payer: Self-pay

## 2024-06-17 MED ORDER — METFORMIN HCL 500 MG PO TABS
1000.0000 mg | ORAL_TABLET | Freq: Two times a day (BID) | ORAL | 0 refills | Status: DC
  Filled 2024-06-17: qty 180, 45d supply, fill #0

## 2024-07-08 ENCOUNTER — Other Ambulatory Visit (HOSPITAL_COMMUNITY): Payer: Self-pay

## 2024-07-08 ENCOUNTER — Other Ambulatory Visit: Payer: Self-pay | Admitting: Internal Medicine

## 2024-07-08 ENCOUNTER — Other Ambulatory Visit: Payer: Self-pay | Admitting: "Endocrinology

## 2024-07-08 ENCOUNTER — Other Ambulatory Visit

## 2024-07-08 ENCOUNTER — Other Ambulatory Visit: Payer: Self-pay

## 2024-07-08 DIAGNOSIS — E8881 Metabolic syndrome: Secondary | ICD-10-CM

## 2024-07-08 DIAGNOSIS — E039 Hypothyroidism, unspecified: Secondary | ICD-10-CM

## 2024-07-08 DIAGNOSIS — I10 Essential (primary) hypertension: Secondary | ICD-10-CM

## 2024-07-08 DIAGNOSIS — E119 Type 2 diabetes mellitus without complications: Secondary | ICD-10-CM

## 2024-07-08 DIAGNOSIS — E782 Mixed hyperlipidemia: Secondary | ICD-10-CM

## 2024-07-08 DIAGNOSIS — Z0000 Encounter for general adult medical examination without abnormal findings: Secondary | ICD-10-CM

## 2024-07-08 MED ORDER — SYNTHROID 112 MCG PO TABS
112.0000 ug | ORAL_TABLET | Freq: Every day | ORAL | 0 refills | Status: DC
  Filled 2024-07-08: qty 90, 90d supply, fill #0

## 2024-07-08 MED ORDER — OLMESARTAN MEDOXOMIL-HCTZ 40-25 MG PO TABS
1.0000 | ORAL_TABLET | Freq: Every day | ORAL | 1 refills | Status: AC
  Filled 2024-07-08: qty 90, 90d supply, fill #0

## 2024-07-08 MED ORDER — METFORMIN HCL 500 MG PO TABS
ORAL_TABLET | ORAL | 0 refills | Status: AC
  Filled 2024-07-08: qty 120, fill #0
  Filled 2024-07-09 – 2024-07-22 (×2): qty 120, 30d supply, fill #0

## 2024-07-09 ENCOUNTER — Ambulatory Visit: Payer: Self-pay | Admitting: Internal Medicine

## 2024-07-09 ENCOUNTER — Other Ambulatory Visit (HOSPITAL_COMMUNITY): Payer: Self-pay

## 2024-07-09 ENCOUNTER — Other Ambulatory Visit: Payer: Self-pay

## 2024-07-09 ENCOUNTER — Other Ambulatory Visit (HOSPITAL_BASED_OUTPATIENT_CLINIC_OR_DEPARTMENT_OTHER): Payer: Self-pay

## 2024-07-09 LAB — COMPREHENSIVE METABOLIC PANEL WITH GFR
AG Ratio: 2.1 (calc) (ref 1.0–2.5)
ALT: 30 U/L (ref 9–46)
AST: 30 U/L (ref 10–35)
Albumin: 5 g/dL (ref 3.6–5.1)
Alkaline phosphatase (APISO): 34 U/L — ABNORMAL LOW (ref 35–144)
BUN/Creatinine Ratio: 25 (calc) — ABNORMAL HIGH (ref 6–22)
BUN: 28 mg/dL — ABNORMAL HIGH (ref 7–25)
CO2: 28 mmol/L (ref 20–32)
Calcium: 10.3 mg/dL (ref 8.6–10.3)
Chloride: 102 mmol/L (ref 98–110)
Creat: 1.14 mg/dL (ref 0.70–1.30)
Globulin: 2.4 g/dL (ref 1.9–3.7)
Glucose, Bld: 107 mg/dL — ABNORMAL HIGH (ref 65–99)
Potassium: 4.1 mmol/L (ref 3.5–5.3)
Sodium: 139 mmol/L (ref 135–146)
Total Bilirubin: 0.5 mg/dL (ref 0.2–1.2)
Total Protein: 7.4 g/dL (ref 6.1–8.1)
eGFR: 75 mL/min/1.73m2 (ref >=60)

## 2024-07-09 LAB — CBC WITH DIFFERENTIAL/PLATELET
Absolute Lymphocytes: 1452 {cells}/uL (ref 850–3900)
Absolute Monocytes: 258 {cells}/uL (ref 200–950)
Basophils Absolute: 19 {cells}/uL (ref 0–200)
Basophils Relative: 0.5 %
Eosinophils Absolute: 30 {cells}/uL (ref 15–500)
Eosinophils Relative: 0.8 %
HCT: 43.2 % (ref 38.5–50.0)
Hemoglobin: 14.5 g/dL (ref 13.2–17.1)
MCH: 27.1 pg (ref 27.0–33.0)
MCHC: 33.6 g/dL (ref 32.0–36.0)
MCV: 80.7 fL (ref 80.0–100.0)
MPV: 10.3 fL (ref 7.5–12.5)
Monocytes Relative: 6.8 %
Neutro Abs: 2041 {cells}/uL (ref 1500–7800)
Neutrophils Relative %: 53.7 %
Platelets: 285 Thousand/uL (ref 140–400)
RBC: 5.35 Million/uL (ref 4.20–5.80)
RDW: 13.7 % (ref 11.0–15.0)
Total Lymphocyte: 38.2 %
WBC: 3.8 Thousand/uL (ref 3.8–10.8)

## 2024-07-09 LAB — LIPID PANEL
Cholesterol: 125 mg/dL (ref <200)
HDL: 37 mg/dL — ABNORMAL LOW (ref >=40)
LDL Cholesterol (Calc): 65 mg/dL
Non-HDL Cholesterol (Calc): 88 mg/dL (ref <130)
Total CHOL/HDL Ratio: 3.4 (calc) (ref <5.0)
Triglycerides: 144 mg/dL (ref <150)

## 2024-07-09 LAB — MICROALBUMIN / CREATININE URINE RATIO
Creatinine, Urine: 147 mg/dL (ref 20–320)
Microalb Creat Ratio: 4 mg/g{creat} (ref <30)
Microalb, Ur: 0.6 mg/dL

## 2024-07-09 LAB — HEMOGLOBIN A1C
Hgb A1c MFr Bld: 6.3 % — ABNORMAL HIGH (ref <5.7)
Mean Plasma Glucose: 134 mg/dL
eAG (mmol/L): 7.4 mmol/L

## 2024-07-09 LAB — PSA: PSA: 0.99 ng/mL (ref <=4.00)

## 2024-07-09 NOTE — Progress Notes (Signed)
 Annual Comprehensive Physical Exam    Patient Care Team: Perri Ronal PARAS, MD as PCP - General (Internal Medicine)  Visit Date: 07/11/24   Chief Complaint  Patient presents with   Annual Exam   Subjective:  Patient: Benjamin Lopez, Male DOB: 1966/02/16, 58 y.o. MRN: [id number] Vitals:   07/11/24 1041  BP: 100/72   THADDIUS Lopez is a 58 y.o. Male who presents today for his Annual Comprehensive Physical Exam . Patient has Hyperlipidemia; PALPITATIONS; Hypertension; Hypothyroidism; Herpes simplex type 1 infection; Diabetes mellitus without complication (HCC); and Hx of colonic polyps on their problem list.  History of Diabetes Mellitus, Type II treated with Metformin  500 mg twice daily with a meal. 07/08/2024 HgbA1c 6.3% decreased from 06/20/2023 6.3%  History of Hypertension treated with Benicar  HCT 40-25 mg daily. Blood pressure today is normal at 100/72. Will continue to monitor blood pressure at home.   History of Hyperlipidemia treated with Fenofibrate  mg, Rosuvastatin  20 mg daily.  07/08/2024 Lipid Panel HDL 37 otherwise WNL.   History of Hypothyroidism treated with Synthroid  112 mcg daily.      Past medical history: Fractured right clavicle 1991, fracture right tibia 1983, mononucleosis at age 31.  History of poststreptococcal glomerulonephritis in 1996.  Fractured right elbow 1998.  Bilateral hydrocele repair in 1971.  History of duplication of left collecting system.   No known drug allergies.  Labs 07/08/2024 Blood glucose 107, BUN 28, BUN/ Creatinine Ratio 25 APISO 34, HgbA1c 6.3%.     07/18/2018 Coronary Calcium  score: 0   06/13/2018 Colonoscopy One 4 mm polyp in the transverse colon Resected and retrieved. Two 1 to 2 mm polyps in the ascending colon and in the cecum Resected and retrieved. The examination was otherwise normal on direct and retroflexion views. The polyps were found to be adenomatous. Sessile serrated polyp and benign mucosal polyp. Repeat in 5 years.     Vaccine counseling: Pneumonia vaccine received today.    Health Maintenance  Topic Date Due   Hepatitis B Vaccines 19-59 Average Risk (1 of 3 - 19+ 3-dose series) Never done   DTaP/Tdap/Td (6 - Tdap) 06/30/1998   Zoster Vaccines- Shingrix (1 of 2) Never done   Colonoscopy  06/14/2023   FOOT EXAM  12/13/2023   COVID-19 Vaccine (8 - 2025-26 season) 06/03/2024   OPHTHALMOLOGY EXAM  09/19/2024   HEMOGLOBIN A1C  01/06/2025   Diabetic kidney evaluation - eGFR measurement  07/08/2025   Diabetic kidney evaluation - Urine ACR  07/08/2025   Pneumococcal Vaccine: 50+ Years  Completed   HPV VACCINES  Aged Out   Meningococcal B Vaccine  Aged Out   Influenza Vaccine  Discontinued   Hepatitis C Screening  Discontinued   HIV Screening  Discontinued      Review of Systems  Constitutional:  Negative for fever and malaise/fatigue.  HENT:  Negative for congestion.   Eyes:  Negative for blurred vision.  Respiratory:  Negative for cough and shortness of breath.   Cardiovascular:  Negative for chest pain, palpitations and leg swelling.  Gastrointestinal:  Negative for vomiting.  Musculoskeletal:  Negative for back pain.  Skin:  Negative for rash.  Neurological:  Negative for loss of consciousness and headaches.   Objective:  Vitals: body mass index is 29.57 kg/m. Today's Vitals   07/11/24 1041  BP: 100/72  Pulse: 74  SpO2: 97%  Weight: 212 lb (96.2 kg)  Height: 5' 11 (1.803 m)  PainSc: 0-No pain   Physical Exam Vitals and  nursing note reviewed. Exam conducted with a chaperone present.  Constitutional:      General: He is awake. He is not in acute distress.    Appearance: Normal appearance. He is not ill-appearing or toxic-appearing.  HENT:     Head: Normocephalic and atraumatic.     Right Ear: Tympanic membrane, ear canal and external ear normal.     Left Ear: Tympanic membrane, ear canal and external ear normal.     Mouth/Throat:     Pharynx: Oropharynx is clear.  Eyes:      Extraocular Movements: Extraocular movements intact.     Pupils: Pupils are equal, round, and reactive to light.  Neck:     Thyroid : No thyroid  mass, thyromegaly or thyroid  tenderness.     Vascular: No carotid bruit.  Cardiovascular:     Rate and Rhythm: Normal rate and regular rhythm. No extrasystoles are present.    Pulses:          Dorsalis pedis pulses are 2+ on the right side and 2+ on the left side.       Posterior tibial pulses are 2+ on the right side and 2+ on the left side.     Heart sounds: Normal heart sounds. No murmur heard.    No friction rub. No gallop.  Pulmonary:     Effort: Pulmonary effort is normal.     Breath sounds: Normal breath sounds. No decreased breath sounds, wheezing, rhonchi or rales.  Chest:     Chest wall: No mass.  Abdominal:     Palpations: Abdomen is soft. There is no hepatomegaly, splenomegaly or mass.     Tenderness: There is no abdominal tenderness.     Hernia: No hernia is present.  Genitourinary:    Prostate: Normal. Not enlarged, not tender and no nodules present.     Rectum: Normal. Guaiac result negative.  Musculoskeletal:     Cervical back: Normal range of motion.     Right lower leg: No edema.     Left lower leg: No edema.  Lymphadenopathy:     Cervical: No cervical adenopathy.     Upper Body:     Right upper body: No supraclavicular adenopathy.     Left upper body: No supraclavicular adenopathy.  Skin:    General: Skin is warm and dry.  Neurological:     General: No focal deficit present.     Mental Status: He is alert and oriented to person, place, and time. Mental status is at baseline.     Cranial Nerves: Cranial nerves 2-12 are intact.     Sensory: Sensation is intact.     Motor: Motor function is intact.     Coordination: Coordination is intact.     Gait: Gait is intact.     Deep Tendon Reflexes: Reflexes are normal and symmetric.  Psychiatric:        Attention and Perception: Attention normal.        Mood and Affect:  Mood normal.        Speech: Speech normal.        Behavior: Behavior normal. Behavior is cooperative.        Thought Content: Thought content normal.        Cognition and Memory: Cognition and memory normal.        Judgment: Judgment normal.     Current Outpatient Medications  Medication Instructions   amLODipine  (NORVASC ) 5 mg, Oral, Daily   Blood Glucose Monitoring Suppl (BLOOD GLUCOSE MONITOR SYSTEM)  w/Device KIT Use in the morning, at noon, and at bedtime.   fenofibrate  (TRICOR ) 145 mg, Oral, Daily   metFORMIN  (GLUCOPHAGE ) 500 MG tablet Take 2 tablets (1,000 mg total) by mouth 2 (two) times daily with a meal. Contact office for follow up appointment   olmesartan -hydrochlorothiazide  (BENICAR  HCT) 40-25 MG tablet 1 tablet, Oral, Daily   olmesartan -hydrochlorothiazide  (BENICAR  HCT) 40-25 MG tablet 1 tablet, Oral, Daily   rosuvastatin  (CRESTOR ) 20 mg, Oral, Daily   Synthroid  112 mcg, Oral, Daily   valACYclovir  (VALTREX ) 500 mg, Oral, 2 times daily   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   History of mononucleosis    at age 66   Hx of colonic polyps 06/13/2018   Hyperlipidemia    Hypertension    Thyroid  disease    Medical/Surgical History Narrative:  Allergic/Intolerant to: No Known Allergies  Past Surgical History:  Procedure Laterality Date   HYDROCELE EXCISION / REPAIR  1971   bilateral   WISDOM TOOTH EXTRACTION     Family History  Problem Relation Age of Onset   Hyperlipidemia Mother    Hypertension Mother    Heart attack Mother    Diabetes Mother    Colon cancer Mother 43       dx at 10    Diabetes Maternal Grandmother    Depression Maternal Grandfather    Hyperlipidemia Maternal Grandfather    Heart disease Maternal Grandfather    Diabetes Paternal Grandmother    CAD Paternal Grandfather    Diabetes Maternal Aunt    Hyperlipidemia Maternal Aunt    Depression Maternal Aunt    Colon polyps Maternal Uncle    Esophageal cancer Neg Hx    Rectal cancer Neg Hx    Stomach cancer Neg Hx    Family History Narrative: Mother with history of coronary artery disease, impaired glucose tolerance, colon cancer, hypertension, COPD and hyperlipidemia as well as osteoarthritis. Father's family history is unknown. He is an only child. Maternal aunt died from complications of COPD but had history of smoking, diabetes, breast cancer and hyperlipidemia. Mother passed away from complications of lung cancer Social History   Social History Narrative   He is married.  2 daughters.  Does not smoke.  Social alcohol consumption.  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  Recently accepted a position in the intensive care unit at [employer] and will be starting work soon.  Most Recent Health Risks Assessment:   Most Recent Social Determinants of Health (Including Hx of Tobacco, Alcohol, and Drug Use) SDOH Screenings   Food Insecurity: No Food Insecurity (07/11/2024)  Housing: Low Risk  (07/11/2024)  Transportation Needs: No Transportation Needs (07/11/2024)  Utilities: Not At Risk (07/11/2024)  Alcohol Screen: Low Risk  (07/11/2024)  Depression (PHQ2-9): Low Risk  (07/11/2024)  Tobacco Use: Low Risk  (07/11/2024)   Social History   Tobacco Use   Smoking status: Never   Smokeless tobacco: Never  Substance Use Topics   Alcohol use: No   Drug use: No  Most Recent Fall Risk Assessment:    12/13/2022   10:37 AM  Fall Risk   Falls in the past year? 0  Number falls in past yr: 0  Injury with Fall? 0  Risk for fall due to : No Fall Risks  Follow up Falls  prevention discussed   Most Recent Anxiety/Depression Screenings:    07/11/2024   10:48 AM 04/19/2024    3:17 PM  PHQ 2/9 Scores  PHQ - 2 Score 0 0     Results:  Studies Obtained And Personally Reviewed By Me:  07/18/2018 Coronary Calcium  score: 0   06/13/2018 Colonoscopy One 4 mm polyp in the transverse colon Resected and retrieved. Two 1 to 2 mm polyps in the ascending colon and in the cecum Resected and retrieved. The examination was otherwise normal on direct and retroflexion views. The polyps were found to be adenomatous. Sessile serrated polyp and benign mucosal polyp. Repeat in 5 years.   Labs:  CBC w/ Differential Lab Results  Component Value Date   WBC 3.8 07/08/2024   RBC 5.35 07/08/2024   HGB 14.5 07/08/2024   HCT 43.2 07/08/2024   PLT 285 07/08/2024   MCV 80.7 07/08/2024   MCH 27.1 07/08/2024   MCHC 33.6 07/08/2024   RDW 13.7 07/08/2024   MPV 10.3 07/08/2024   LYMPHSABS 1,971 06/20/2023   MONOABS 392 05/05/2017   BASOSABS 19 07/08/2024    Comprehensive Metabolic Panel Lab Results  Component Value Date   NA 139 07/08/2024   K 4.1 07/08/2024   CL 102 07/08/2024   CO2 28 07/08/2024   GLUCOSE 107 (H) 07/08/2024   BUN 28 (H) 07/08/2024   CREATININE 1.14 07/08/2024   CALCIUM  10.3 07/08/2024   PROT 7.4 07/08/2024   ALBUMIN 4.7 05/05/2017   AST 30 07/08/2024   ALT 30 07/08/2024   ALKPHOS 57 05/05/2017   BILITOT 0.5 07/08/2024   EGFR 75 07/08/2024   GFRNONAA 70 06/27/2019   Lipid Panel  Lab Results  Component Value Date   CHOL 125 07/08/2024   HDL 37 (L) 07/08/2024   LDLCALC 65 07/08/2024   TRIG 144 07/08/2024   A1c Lab Results  Component Value Date   HGBA1C 6.3 (H) 07/08/2024    TSH Lab Results  Component Value Date   TSH 3.92 12/08/2022   07/08/2024 PSA 0.99 Assessment & Plan:   Orders Placed This Encounter  Procedures   Pneumococcal conjugate vaccine 20-valent (Prevnar 20)   POCT URINALYSIS DIP (CLINITEK)   Diabetes Mellitus, Type  II:  treated with Metformin  500 mg twice daily with a meal. 07/08/2024 HgbA1c 6.3% decreased from 06/20/2023  when it was 7.6%  Hypertension: treated with Benicar  HCT 40-25 mg daily. Blood pressure today is 100/72. Will continue to monitor blood pressure at home.   Hyperlipidemia: treated with Fenofibrate  mg, Rosuvastatin  20 mg daily.  07/08/2024 Lipid Panel HDL 37 otherwise WNL.   Hypothyroidism: treated with Synthroid  112 mcg daily.  TSH was not drawn with CPE labs and could not be added. Feels OK on this dose. Endocrinologist may want to check TSH at upcoming visit.    07/18/2018 Coronary Calcium  score: 0   06/13/2018 Colonoscopy One 4 mm polyp in the transverse colon Resected and retrieved. Two 1 to 2 mm polyps in the ascending colon and in the cecum Resected and retrieved. The examination was otherwise normal on direct and retroflexion views. The polyps were found to be adenomatous. Sessile serrated polyp and benign mucosal polyp. Repeat in 5  years.    Vaccine counseling: Pneumococcal  vaccine received today.   Suggest patient return in 6-12 months or as needed. Will be seeing Endocrinologist soon.     Annual Comprehensive Physical Exam done today including the all of the following: Reviewed patient's Family Medical History Reviewed patient's SDOH and reviewed tobacco, alcohol, and drug use.  Reviewed and updated list of patient's medical providers Assessment of cognitive impairment was done Assessed patient's functional ability Established a written schedule for health screening services Health Risk Assessent Completed and Reviewed  Discussed health benefits of physical activity, and encouraged him to engage in regular exercise appropriate for his age and condition.    I,Makayla C Reid,acting as a scribe for Ronal JINNY Hailstone, MD.,have documented all relevant documentation on the behalf of Ronal JINNY Hailstone, MD,as directed by  Ronal JINNY Hailstone, MD while in the presence of Ronal JINNY Hailstone,  MD.   I, Ronal JINNY Hailstone, MD, have reviewed all documentation for and agree with the above Annual Wellness Visit documentation.  Ronal JINNY Hailstone, MD Internal Medicine 07/11/2024

## 2024-07-11 ENCOUNTER — Encounter: Payer: Self-pay | Admitting: Internal Medicine

## 2024-07-11 ENCOUNTER — Ambulatory Visit: Admitting: Internal Medicine

## 2024-07-11 VITALS — BP 100/72 | HR 74 | Ht 71.0 in | Wt 212.0 lb

## 2024-07-11 DIAGNOSIS — E782 Mixed hyperlipidemia: Secondary | ICD-10-CM

## 2024-07-11 DIAGNOSIS — E119 Type 2 diabetes mellitus without complications: Secondary | ICD-10-CM | POA: Diagnosis not present

## 2024-07-11 DIAGNOSIS — E786 Lipoprotein deficiency: Secondary | ICD-10-CM

## 2024-07-11 DIAGNOSIS — E039 Hypothyroidism, unspecified: Secondary | ICD-10-CM

## 2024-07-11 DIAGNOSIS — Z1211 Encounter for screening for malignant neoplasm of colon: Secondary | ICD-10-CM

## 2024-07-11 DIAGNOSIS — Z0000 Encounter for general adult medical examination without abnormal findings: Secondary | ICD-10-CM | POA: Diagnosis not present

## 2024-07-11 DIAGNOSIS — I10 Essential (primary) hypertension: Secondary | ICD-10-CM

## 2024-07-11 DIAGNOSIS — Z800 Family history of malignant neoplasm of digestive organs: Secondary | ICD-10-CM

## 2024-07-11 DIAGNOSIS — Z23 Encounter for immunization: Secondary | ICD-10-CM

## 2024-07-11 DIAGNOSIS — Z6829 Body mass index (BMI) 29.0-29.9, adult: Secondary | ICD-10-CM

## 2024-07-11 DIAGNOSIS — Z7984 Long term (current) use of oral hypoglycemic drugs: Secondary | ICD-10-CM

## 2024-07-11 LAB — HEMOCCULT GUIAC POC 1CARD (OFFICE): Fecal Occult Blood, POC: NEGATIVE

## 2024-07-11 LAB — POCT URINALYSIS DIP (CLINITEK)
Bilirubin, UA: NEGATIVE
Blood, UA: NEGATIVE
Glucose, UA: NEGATIVE mg/dL
Ketones, POC UA: NEGATIVE mg/dL
Leukocytes, UA: NEGATIVE
Nitrite, UA: NEGATIVE
POC PROTEIN,UA: NEGATIVE
Spec Grav, UA: 1.01 (ref 1.010–1.025)
Urobilinogen, UA: 0.2 U/dL
pH, UA: 7.5 (ref 5.0–8.0)

## 2024-07-15 ENCOUNTER — Telehealth: Payer: Self-pay | Admitting: "Endocrinology

## 2024-07-15 ENCOUNTER — Other Ambulatory Visit: Payer: Self-pay

## 2024-07-15 NOTE — Telephone Encounter (Signed)
 MEDICATION: Metformin   PHARMACY:  [facility] [address], [place] [address] Phone: [phone]  Fax: [phone]   HAS THE PATIENT CONTACTED THEIR PHARMACY? Yes   LAST REFILL:  @@LASTREFILL @  IS THIS A 90 DAY SUPPLY : Yes  IS PATIENT OUT OF MEDICATION:   IF NOT; HOW MUCH IS LEFT:   LAST APPOINTMENT DATE: @10 /03/2024  NEXT APPOINTMENT DATE:@11 /14/2025  DO WE HAVE YOUR PERMISSION TO LEAVE A DETAILED MESSAGE?: Yes  OTHER COMMENTS:    **Let patient know to contact pharmacy at the end of the day to make sure medication is ready. **  ** Please notify patient to allow 48-72 hours to process**  **Encourage patient to contact the pharmacy for refills or they can request refills through [facility]**

## 2024-07-22 ENCOUNTER — Other Ambulatory Visit (HOSPITAL_COMMUNITY): Payer: Self-pay

## 2024-07-22 ENCOUNTER — Other Ambulatory Visit: Payer: Self-pay | Admitting: Internal Medicine

## 2024-07-22 ENCOUNTER — Other Ambulatory Visit: Payer: Self-pay

## 2024-07-22 MED ORDER — AMLODIPINE BESYLATE 5 MG PO TABS
5.0000 mg | ORAL_TABLET | Freq: Every day | ORAL | 1 refills | Status: AC
  Filled 2024-07-22 – 2024-08-25 (×2): qty 90, 90d supply, fill #0

## 2024-07-22 NOTE — Patient Instructions (Addendum)
 It was good to see you today. Labs discussed. Colonoscopy is due. Vaccines reviewed. Patient may be able to get some of these vaccines through [facility] where he will be working in the near future. Patient has appt with Endocrinologist in November.

## 2024-07-23 ENCOUNTER — Ambulatory Visit: Admitting: Internal Medicine

## 2024-07-23 ENCOUNTER — Other Ambulatory Visit (HOSPITAL_COMMUNITY): Payer: Self-pay

## 2024-08-16 ENCOUNTER — Ambulatory Visit: Admitting: "Endocrinology

## 2024-08-25 ENCOUNTER — Other Ambulatory Visit (HOSPITAL_COMMUNITY): Payer: Self-pay

## 2024-08-25 ENCOUNTER — Other Ambulatory Visit: Payer: Self-pay | Admitting: Internal Medicine

## 2024-08-26 ENCOUNTER — Other Ambulatory Visit (HOSPITAL_COMMUNITY): Payer: Self-pay

## 2024-08-26 ENCOUNTER — Other Ambulatory Visit: Payer: Self-pay

## 2024-08-26 MED ORDER — SYNTHROID 112 MCG PO TABS
112.0000 ug | ORAL_TABLET | Freq: Every day | ORAL | 1 refills | Status: AC
  Filled 2024-08-26: qty 90, 90d supply, fill #0
